# Patient Record
Sex: Female | Born: 1935 | Race: Black or African American | Hispanic: No | Smoking: Never smoker
Health system: Southern US, Community
[De-identification: ages and names within clinical notes are randomized; demographics above are authoritative.]

## PROBLEM LIST (undated history)

## (undated) DIAGNOSIS — M1990 Unspecified osteoarthritis, unspecified site: Secondary | ICD-10-CM

## (undated) DIAGNOSIS — E119 Type 2 diabetes mellitus without complications: Secondary | ICD-10-CM

## (undated) DIAGNOSIS — I10 Essential (primary) hypertension: Secondary | ICD-10-CM

## (undated) HISTORY — DX: Type 2 diabetes mellitus without complications: E11.9

## (undated) HISTORY — PX: BREAST EXCISIONAL BIOPSY: SUR124

---

## 2001-01-29 ENCOUNTER — Other Ambulatory Visit: Admission: RE | Admit: 2001-01-29 | Discharge: 2001-01-29 | Payer: Self-pay | Admitting: *Deleted

## 2001-05-22 ENCOUNTER — Ambulatory Visit (HOSPITAL_COMMUNITY): Admission: RE | Admit: 2001-05-22 | Discharge: 2001-05-22 | Payer: Self-pay | Admitting: Gastroenterology

## 2001-08-18 ENCOUNTER — Encounter: Admission: RE | Admit: 2001-08-18 | Discharge: 2001-08-18 | Payer: Self-pay | Admitting: Internal Medicine

## 2001-08-18 ENCOUNTER — Encounter: Payer: Self-pay | Admitting: Internal Medicine

## 2003-05-24 ENCOUNTER — Encounter: Admission: RE | Admit: 2003-05-24 | Discharge: 2003-05-24 | Payer: Self-pay | Admitting: Internal Medicine

## 2004-06-16 ENCOUNTER — Encounter: Admission: RE | Admit: 2004-06-16 | Discharge: 2004-06-16 | Payer: Self-pay | Admitting: Internal Medicine

## 2004-06-30 ENCOUNTER — Ambulatory Visit (HOSPITAL_COMMUNITY): Admission: RE | Admit: 2004-06-30 | Discharge: 2004-06-30 | Payer: Self-pay | Admitting: Ophthalmology

## 2004-08-11 ENCOUNTER — Encounter: Admission: RE | Admit: 2004-08-11 | Discharge: 2004-08-11 | Payer: Self-pay | Admitting: Internal Medicine

## 2005-12-26 ENCOUNTER — Encounter: Admission: RE | Admit: 2005-12-26 | Discharge: 2005-12-26 | Payer: Self-pay | Admitting: Internal Medicine

## 2007-01-01 ENCOUNTER — Encounter: Admission: RE | Admit: 2007-01-01 | Discharge: 2007-01-01 | Payer: Self-pay | Admitting: Internal Medicine

## 2008-01-02 ENCOUNTER — Encounter: Admission: RE | Admit: 2008-01-02 | Discharge: 2008-01-02 | Payer: Self-pay | Admitting: Internal Medicine

## 2008-01-21 ENCOUNTER — Encounter: Admission: RE | Admit: 2008-01-21 | Discharge: 2008-01-21 | Payer: Self-pay | Admitting: Internal Medicine

## 2009-01-03 ENCOUNTER — Encounter: Admission: RE | Admit: 2009-01-03 | Discharge: 2009-01-03 | Payer: Self-pay | Admitting: Internal Medicine

## 2009-06-03 ENCOUNTER — Emergency Department (HOSPITAL_COMMUNITY): Admission: EM | Admit: 2009-06-03 | Discharge: 2009-06-03 | Payer: Self-pay | Admitting: Emergency Medicine

## 2010-01-06 ENCOUNTER — Encounter
Admission: RE | Admit: 2010-01-06 | Discharge: 2010-01-06 | Payer: Self-pay | Source: Home / Self Care | Attending: Internal Medicine | Admitting: Internal Medicine

## 2010-04-03 LAB — URINALYSIS, ROUTINE W REFLEX MICROSCOPIC
Glucose, UA: 500 mg/dL — AB
Ketones, ur: NEGATIVE mg/dL
Nitrite: NEGATIVE
Specific Gravity, Urine: 1.006 (ref 1.005–1.030)
Urobilinogen, UA: 0.2 mg/dL (ref 0.0–1.0)

## 2010-04-03 LAB — COMPREHENSIVE METABOLIC PANEL
AST: 17 U/L (ref 0–37)
Albumin: 3.9 g/dL (ref 3.5–5.2)
BUN: 8 mg/dL (ref 6–23)
CO2: 27 mEq/L (ref 19–32)
Calcium: 9.2 mg/dL (ref 8.4–10.5)
Creatinine, Ser: 0.63 mg/dL (ref 0.4–1.2)
GFR calc Af Amer: >60 mL/min (ref >60)
GFR calc non Af Amer: >60 mL/min (ref >60)
Glucose, Bld: 146 mg/dL — ABNORMAL HIGH (ref 70–99)
Sodium: 138 mEq/L (ref 135–145)
Total Bilirubin: 0.5 mg/dL (ref 0.3–1.2)
Total Protein: 7.3 g/dL (ref 6.0–8.3)

## 2010-04-03 LAB — POCT CARDIAC MARKERS
CKMB, poc: 1.6 ng/mL (ref 1.0–8.0)
Myoglobin, poc: 64.8 ng/mL (ref 12–200)
Myoglobin, poc: 71 ng/mL (ref 12–200)
Troponin i, poc: <0.05 ng/mL (ref 0.00–0.09)

## 2010-04-03 LAB — CBC
MCHC: 32.5 g/dL (ref 30.0–36.0)
MCV: 89.5 fL (ref 78.0–100.0)
RDW: 13.7 % (ref 11.5–15.5)
WBC: 4.3 10*3/uL (ref 4.0–10.5)

## 2010-04-03 LAB — DIFFERENTIAL: Neutrophils Relative %: 77 % (ref 43–77)

## 2010-06-02 NOTE — Op Note (Signed)
NAMEADRIAUNA, April Gallagher                 ACCOUNT NO.:  [id number]   MEDICAL RECORD NO.:  [id number]          PATIENT TYPE:  OIB   LOCATION:  2899                         FACILITY:  [facility]   PHYSICIAN:  Guadelupe Sabin, M.D.DATE OF BIRTH:  18-Oct-1926   DATE OF PROCEDURE:  06/30/2004  DATE OF DISCHARGE:  06/30/2004                                 OPERATIVE REPORT   PREOPERATIVE DIAGNOSIS:  Senile nuclear cataract, right eye.   POSTOPERATIVE DIAGNOSIS:  Senile nuclear cataract, right eye.   NAME OF OPERATION:  Planned extracapsular cataract extraction with  phacoemulsification and primary insertion of posterior chamber intraocular  lens implant.   SURGEON:  Guadelupe Sabin, M.D.   ASSISTANT:  Nurse.   ANESTHESIA:  Local 4% Xylocaine, 0.75 Marcaine retrobulbar block, topical  tetracaine, intraocular Xylocaine, anesthesia standby required in this  elderly patient.   OPERATIVE PROCEDURE:  After the patient was prepped and draped, a lid  speculum was inserted in the left eye. The eye was turned downward and a  superior rectus traction suture placed. Schiotz tonometry was recorded at 8-  10 scale units with a 5.5 g weight. A peritomy was performed adjacent to the  limbus from the 11 to 1 o'clock position. The corneoscleral junction was  cleaned and a corneoscleral groove made with a 45-degree Superblade. The  anterior chamber was then entered with the 2.5 mm diamond keratome at the 12  o'clock position and the 15-degree blade at the 2:30 position. Using a bent  26 gauge needle on an Ocucoat syringe, a circular capsulorrhexis was begun  and then completed with the Grabow forceps. Hydrodissection and  hydrodelineation were performed using 1% Xylocaine. The 30-degree  phacoemulsification tip was then inserted with slow controlled  emulsification of the lens nucleus with back cracking and fragmentation with  the Bechert pick. Total ultrasonic time 1 minute 20 seconds, average power  level  15%, total amount of fluid used 50 mL. Following removal of the  nucleus, the residual cortex was aspirated with the irrigation-aspiration  tip. The posterior capsule appeared intact with a brilliant red fundus  reflex. It was therefore elected to insert an [employer] SI40NB  silicone three-piece posterior chamber intraocular lens implant, diopter  strength +21.00. This was inserted with the McDonald forceps into the  anterior chamber and then centered into the capsular bag using the [facility]  lens rotator. The lens appeared to be well centered. The Ocucoat and  Provisc, which had been used intermittently, were aspirated and replaced  with balanced salt solution and Miochol ophthalmic solution. The operative  incisions were self-sealing. It was elected, however, to place a single 10-0  interrupted nylon suture across the 12 o'clock incision to ensure closure  and to prevent  endophthalmitis. Maxitrol ointment was instilled in the conjunctival cul-de-  sac and a light patch and protective shield applied. Duration of procedure  and anesthesia administration 45 minutes. The patient tolerated the  procedure well in general and left the operating room for the recovery room  in good condition.       HNJ/MEDQ  D:  07/01/2004  T:  07/01/2004  Job:  [id number]

## 2010-06-02 NOTE — Procedures (Signed)
[facility]  Patient:    KINGSLEE, DOWSE Visit Number: [id number] MRN: [id number]          Service Type: END Location: ENDO Attending Physician:  Charna Elizabeth Dictated by:   Anselmo Rod, M.D. Proc. Date: 05/22/01 Admit Date:  05/22/2001   CC:         Kern Reap, M.D.   Procedure Report  DATE OF BIRTH:  07/19/1926  REFERRING PHYSICIAN:  Kern Reap, M.D.  PROCEDURE PERFORMED:  Screening colonoscopy.  ENDOSCOPIST:  Anselmo Rod, M.D.  INSTRUMENT USED:  Olympus video colonoscope.  INDICATIONS FOR PROCEDURE:  The patient is a 77 year old African-American female with a history of colon cancer in her sister and her mother.  Rule out colonic polyps, masses, hemorrhoids, etc.  PREPROCEDURE PREPARATION:  Informed consent was procured from the patient. The patient was fasted for eight hours prior to the procedure and prepped with a bottle of magnesium citrate and a gallon of NuLytely the night prior to the procedure.  PREPROCEDURE PHYSICAL:  The patient had stable vital signs.  Neck supple. Chest clear to auscultation.  S1, S2 regular.  Abdomen soft with normal bowel sounds.  DESCRIPTION OF PROCEDURE:  The patient was placed in the left lateral decubitus position and sedated with 50 mg of Demerol and 5 mg of Versed intravenously.  Once the patient was adequately sedated and maintained on low-flow oxygen and continuous cardiac monitoring, the Olympus video colonoscope was advanced from the rectum to the cecum with extreme difficulty. The patient has a very atonic and tortuous colon.  A few scattered diverticula were seen throughout the colon.  No masses or polyps were present.  IMPRESSION: 1. Pandiverticulosis. 2. No masses or polyps seen.  RECOMMENDATIONS: 1. Repeat colorectal cancer screening is recommended in the next five years    unless the patient were to develop any abnormal symptoms in the interim. 2. A high  fiber diet has been recommended. 3. Outpatient follow-up is advised as need arises. Dictated by:   Anselmo Rod, M.D. Attending Physician:  Charna Elizabeth DD:  05/22/01 TD:  05/23/01 Job: [id number]

## 2010-06-02 NOTE — H&P (Signed)
April Gallagher, April Gallagher                 ACCOUNT NO.:  [id number]   MEDICAL RECORD NO.:  [id number]          PATIENT TYPE:  OIB   LOCATION:  2899                         FACILITY:  [facility]   PHYSICIAN:  Guadelupe Sabin, M.D.DATE OF BIRTH:  03/16/1926   DATE OF ADMISSION:  06/30/2004  DATE OF DISCHARGE:  06/30/2004                                HISTORY & PHYSICAL   This was a planned outpatient surgical admission of this 77 year old black  female admitted for cataract implant surgery of the right eye.   PRESENT ILLNESS:  This patient was recently seen by her optometrist Dr.  Burna Sis and was found to have bilateral cataract formation. The patient  was referred to my office where this diagnosis was confirmed and  arrangements made for the patient's outpatient admission at this time.   PAST MEDICAL HISTORY:  The patient is under the care of Dr. Barbee Shropshire. The  patient is felt to be a non-insulin-dependent diabetic with diet control  only. The patient checks her sugars occasionally. Blood pressure is stable.   CURRENT MEDICATIONS:  Micardis, Lipitor, garlic, Fosamax, flaxseed and  calcium.   REVIEW OF SYSTEMS:  No current cardiorespiratory complaints.   PHYSICAL EXAMINATION:  VITAL SIGNS:  As recorded on admission, blood  pressure 180/86, pulse 80, respirations 16 and temperature 97.8 degrees.  GENERAL APPEARANCE:  The patient is a pleasant, well-nourished, well-  developed, black female in no acute distress.  HEENT:  Eyes:  Visual acuity without correction 20/100 right eye and 20/40  left eye. With refraction 20/50 right eye and 20/25 left eye. On slit lamp  examination, the eyes are white and clear with a clear cornea and deep and  clear anterior chamber. A dense nuclear cataract is present in both eyes.  Dilated detailed fundus examination with indirect ophthalmoscopy shows a  clear vitreous behind the cataractous lens. The retina is attached. The  optic nerve is sharply outlined and  of good color. Disk/cup ratio 0.4. Blood  vessels and macula normal.  CHEST:  Lungs clear to percussion and auscultation.  HEART:  Normal sinus rhythm. No cardiomegaly. No murmurs.  ABDOMEN:  Negative.  EXTREMITIES:  Negative.   ADMISSION DIAGNOSIS:  Senile cataract, both eyes.   SURGICAL PLAN:  Cataract implant surgery, right eye now and left eye later  as needed.       HNJ/MEDQ  D:  07/01/2004  T:  07/01/2004  Job:  [id number]

## 2012-04-08 ENCOUNTER — Other Ambulatory Visit: Payer: Self-pay | Admitting: Internal Medicine

## 2012-04-08 DIAGNOSIS — Z1231 Encounter for screening mammogram for malignant neoplasm of breast: Secondary | ICD-10-CM

## 2012-05-08 ENCOUNTER — Ambulatory Visit
Admission: RE | Admit: 2012-05-08 | Discharge: 2012-05-08 | Disposition: A | Discharge status: Home / Self Care | Payer: Medicare Other | Source: Ambulatory Visit | Attending: Internal Medicine | Admitting: Internal Medicine

## 2012-05-08 DIAGNOSIS — Z1231 Encounter for screening mammogram for malignant neoplasm of breast: Secondary | ICD-10-CM

## 2012-10-23 ENCOUNTER — Ambulatory Visit: Payer: Self-pay | Admitting: Podiatry

## 2013-05-27 DIAGNOSIS — H26499 Other secondary cataract, unspecified eye: Secondary | ICD-10-CM | POA: Insufficient documentation

## 2013-08-03 ENCOUNTER — Encounter: Payer: Self-pay | Admitting: Podiatry

## 2013-08-03 ENCOUNTER — Ambulatory Visit (INDEPENDENT_AMBULATORY_CARE_PROVIDER_SITE_OTHER): Payer: Medicare Other | Admitting: Podiatry

## 2013-08-03 DIAGNOSIS — M79609 Pain in unspecified limb: Secondary | ICD-10-CM

## 2013-08-03 DIAGNOSIS — M79673 Pain in unspecified foot: Secondary | ICD-10-CM

## 2013-08-03 DIAGNOSIS — Q828 Other specified congenital malformations of skin: Secondary | ICD-10-CM

## 2013-08-03 DIAGNOSIS — B351 Tinea unguium: Secondary | ICD-10-CM

## 2013-08-03 DIAGNOSIS — E1159 Type 2 diabetes mellitus with other circulatory complications: Secondary | ICD-10-CM

## 2013-08-03 NOTE — Patient Instructions (Signed)
Diabetes and Foot Care Diabetes may cause you to have problems because of poor blood supply (circulation) to your feet and legs. This may cause the skin on your feet to become thinner, break easier, and heal more slowly. Your skin may become dry, and the skin may peel and crack. You may also have nerve damage in your legs and feet causing decreased feeling in them. You may not notice minor injuries to your feet that could lead to infections or more serious problems. Taking care of your feet is one of the most important things you can do for yourself.  HOME CARE INSTRUCTIONS  Wear shoes at all times, even in the house. Do not go barefoot. Bare feet are easily injured.  Check your feet daily for blisters, cuts, and redness. If you cannot see the bottom of your feet, use a mirror or ask someone for help.  Wash your feet with warm water (do not use hot water) and mild soap. Then pat your feet and the areas between your toes until they are completely dry. Do not soak your feet as this can dry your skin.  Apply a moisturizing lotion or petroleum jelly (that does not contain alcohol and is unscented) to the skin on your feet and to dry, brittle toenails. Do not apply lotion between your toes.  Trim your toenails straight across. Do not dig under them or around the cuticle. File the edges of your nails with an emery board or nail file.  Do not cut corns or calluses or try to remove them with medicine.  Wear clean socks or stockings every day. Make sure they are not too tight. Do not wear knee-high stockings since they may decrease blood flow to your legs.  Wear shoes that fit properly and have enough cushioning. To break in new shoes, wear them for just a few hours a day. This prevents you from injuring your feet. Always look in your shoes before you put them on to be sure there are no objects inside.  Do not cross your legs. This may decrease the blood flow to your feet.  If you find a minor scrape,  cut, or break in the skin on your feet, keep it and the skin around it clean and dry. These areas may be cleansed with mild soap and water. Do not cleanse the area with peroxide, alcohol, or iodine.  When you remove an adhesive bandage, be sure not to damage the skin around it.  If you have a wound, look at it several times a day to make sure it is healing.  Do not use heating pads or hot water bottles. They may burn your skin. If you have lost feeling in your feet or legs, you may not know it is happening until it is too late.  Make sure your health care provider performs a complete foot exam at least annually or more often if you have foot problems. Report any cuts, sores, or bruises to your health care provider immediately. SEEK MEDICAL CARE IF:   You have an injury that is not healing.  You have cuts or breaks in the skin.  You have an ingrown nail.  You notice redness on your legs or feet.  You feel burning or tingling in your legs or feet.  You have pain or cramps in your legs and feet.  Your legs or feet are numb.  Your feet always feel cold. SEEK IMMEDIATE MEDICAL CARE IF:   There is increasing redness,   swelling, or pain in or around a wound.  There is a red line that goes up your leg.  Pus is coming from a wound.  You develop a fever or as directed by your health care provider.  You notice a bad smell coming from an ulcer or wound. Document Released: 12/30/1999 Document Revised: 09/03/2012 Document Reviewed: 06/10/2012 ExitCare Patient Information 2015 [employer]. This information is not intended to replace advice given to you by your health care provider. Make sure you discuss any questions you have with your health care provider.  

## 2013-08-05 NOTE — Progress Notes (Signed)
Subjective:     Patient ID: April Gallagher, female   DOB: [birth year], 80 y.o.   MRN: [id number]  HPI patient presents with thick yellow nailbeds 1-5 both feet and lesions on plantar aspect of the fourth metatarsal right and third toe right that's painful with inability to cut herself and long-term diabetes   Review of Systems     Objective:   Physical Exam Neurovascular status diminished but no change with patient having E. long gaited thick and yellow brittle nailbeds 1-5 both feet and keratotic lesion sub-fourth metatarsal right third toe right that are painful when pressed    Assessment:     At risk diabetic with nail disease 1-5 both feet and lesions on the plantar aspect of the right foot that are painful    Plan:     Debris painful nailbeds 1-5 both feet and lesions on the right foot with no iatrogenic bleeding noted

## 2013-11-02 ENCOUNTER — Ambulatory Visit: Payer: Medicare Other | Admitting: Podiatry

## 2013-11-09 DIAGNOSIS — Z961 Presence of intraocular lens: Secondary | ICD-10-CM | POA: Insufficient documentation

## 2014-05-15 DIAGNOSIS — H4051X2 Glaucoma secondary to other eye disorders, right eye, moderate stage: Secondary | ICD-10-CM | POA: Insufficient documentation

## 2014-06-16 ENCOUNTER — Encounter (HOSPITAL_COMMUNITY): Payer: Self-pay | Admitting: Emergency Medicine

## 2014-06-16 ENCOUNTER — Emergency Department (HOSPITAL_COMMUNITY)
Admission: EM | Admit: 2014-06-16 | Discharge: 2014-06-16 | Disposition: A | Discharge status: Home / Self Care | Payer: Medicare Other | Attending: Emergency Medicine | Admitting: Emergency Medicine

## 2014-06-16 ENCOUNTER — Emergency Department (HOSPITAL_COMMUNITY): Payer: Medicare Other

## 2014-06-16 DIAGNOSIS — Y92410 Unspecified street and highway as the place of occurrence of the external cause: Secondary | ICD-10-CM | POA: Diagnosis not present

## 2014-06-16 DIAGNOSIS — Z79899 Other long term (current) drug therapy: Secondary | ICD-10-CM | POA: Insufficient documentation

## 2014-06-16 DIAGNOSIS — E119 Type 2 diabetes mellitus without complications: Secondary | ICD-10-CM | POA: Diagnosis not present

## 2014-06-16 DIAGNOSIS — I10 Essential (primary) hypertension: Secondary | ICD-10-CM | POA: Diagnosis not present

## 2014-06-16 DIAGNOSIS — Y9389 Activity, other specified: Secondary | ICD-10-CM | POA: Diagnosis not present

## 2014-06-16 DIAGNOSIS — Z7952 Long term (current) use of systemic steroids: Secondary | ICD-10-CM | POA: Diagnosis not present

## 2014-06-16 DIAGNOSIS — S299XXA Unspecified injury of thorax, initial encounter: Secondary | ICD-10-CM | POA: Diagnosis present

## 2014-06-16 DIAGNOSIS — S29019A Strain of muscle and tendon of unspecified wall of thorax, initial encounter: Secondary | ICD-10-CM

## 2014-06-16 DIAGNOSIS — Y998 Other external cause status: Secondary | ICD-10-CM | POA: Diagnosis not present

## 2014-06-16 DIAGNOSIS — S29012A Strain of muscle and tendon of back wall of thorax, initial encounter: Secondary | ICD-10-CM | POA: Diagnosis not present

## 2014-06-16 DIAGNOSIS — Z8739 Personal history of other diseases of the musculoskeletal system and connective tissue: Secondary | ICD-10-CM | POA: Insufficient documentation

## 2014-06-16 HISTORY — DX: Unspecified osteoarthritis, unspecified site: M19.90

## 2014-06-16 HISTORY — DX: Essential (primary) hypertension: I10
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
## 2014-06-16 NOTE — ED Notes (Signed)
Signature pad not working. 
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
## 2014-07-05 DIAGNOSIS — M545 Low back pain, unspecified: Secondary | ICD-10-CM | POA: Insufficient documentation

## 2014-09-07 ENCOUNTER — Ambulatory Visit (INDEPENDENT_AMBULATORY_CARE_PROVIDER_SITE_OTHER): Payer: Medicare Other | Admitting: Podiatry

## 2014-09-07 ENCOUNTER — Encounter: Payer: Self-pay | Admitting: Podiatry

## 2014-09-07 DIAGNOSIS — B351 Tinea unguium: Secondary | ICD-10-CM | POA: Diagnosis not present

## 2014-09-07 DIAGNOSIS — Q828 Other specified congenital malformations of skin: Secondary | ICD-10-CM

## 2014-09-07 DIAGNOSIS — M79673 Pain in unspecified foot: Secondary | ICD-10-CM

## 2014-09-07 NOTE — Progress Notes (Signed)
Patient ID: April Gallagher, female   DOB: Feb 23, 1926, 81 y.o.   MRN: [id number] Complaint:  Visit Type: Patient returns to my office for continued preventative foot care services. Complaint: Patient states" my nails have grown long and thick and become painful to walk and wear shoes" Patient has been diagnosed with DM with no foot complications. The patient presents for preventative foot care services. No changes to ROS  Podiatric Exam: Vascular: dorsalis pedis and posterior tibial pulses are palpable bilateral. Capillary return is immediate. Temperature gradient is WNL. Skin turgor WNL  Sensorium: Normal Semmes Weinstein monofilament test. Normal tactile sensation bilaterally. Nail Exam: Pt has thick disfigured discolored nails with subungual debris noted bilateral entire nail hallux through fifth toenails Ulcer Exam: There is no evidence of ulcer or pre-ulcerative changes or infection. Orthopedic Exam: Muscle tone and strength are WNL. No limitations in general ROM. No crepitus or effusions noted. Foot type and digits show no abnormalities. Bony prominences are unremarkable. Skin: No Porokeratosis. No infection or ulcers.Porokeratosis sub 2,4 right foot and sub 3 left foot.  Diagnosis:  Onychomycosis, , Pain in right toe, pain in left toes,  Debride porokeratosis  Treatment & Plan Procedures and Treatment: Consent by patient was obtained for treatment procedures. The patient understood the discussion of treatment and procedures well. All questions were answered thoroughly reviewed. Debridement of mycotic and hypertrophic toenails, 1 through 5 bilateral and clearing of subungual debris. No ulceration, no infection noted.  Return Visit-Office Procedure: Patient instructed to return to the office for a follow up visit 3 months for continued evaluation and treatment.

## 2014-10-13 DIAGNOSIS — R269 Unspecified abnormalities of gait and mobility: Secondary | ICD-10-CM | POA: Insufficient documentation

## 2014-12-15 ENCOUNTER — Other Ambulatory Visit: Payer: Self-pay | Admitting: Internal Medicine

## 2014-12-15 DIAGNOSIS — E2839 Other primary ovarian failure: Secondary | ICD-10-CM

## 2014-12-17 ENCOUNTER — Other Ambulatory Visit: Payer: Self-pay | Admitting: Internal Medicine

## 2014-12-17 DIAGNOSIS — Z1231 Encounter for screening mammogram for malignant neoplasm of breast: Secondary | ICD-10-CM

## 2015-01-20 ENCOUNTER — Ambulatory Visit
Admission: RE | Admit: 2015-01-20 | Discharge: 2015-01-20 | Disposition: A | Discharge status: Home / Self Care | Payer: Medicare Other | Source: Ambulatory Visit | Attending: Internal Medicine | Admitting: Internal Medicine

## 2015-01-20 DIAGNOSIS — Z1231 Encounter for screening mammogram for malignant neoplasm of breast: Secondary | ICD-10-CM

## 2015-01-20 DIAGNOSIS — E2839 Other primary ovarian failure: Secondary | ICD-10-CM

## 2015-01-25 ENCOUNTER — Ambulatory Visit: Payer: Medicare Other | Admitting: Podiatry

## 2015-06-28 ENCOUNTER — Encounter: Payer: Self-pay | Admitting: Podiatry

## 2015-06-28 ENCOUNTER — Ambulatory Visit (INDEPENDENT_AMBULATORY_CARE_PROVIDER_SITE_OTHER): Payer: Medicare Other | Admitting: Podiatry

## 2015-06-28 DIAGNOSIS — Q828 Other specified congenital malformations of skin: Secondary | ICD-10-CM

## 2015-06-28 DIAGNOSIS — M79673 Pain in unspecified foot: Secondary | ICD-10-CM | POA: Diagnosis not present

## 2015-06-28 DIAGNOSIS — B351 Tinea unguium: Secondary | ICD-10-CM

## 2015-06-28 NOTE — Progress Notes (Signed)
Patient ID: April Gallagher, female   DOB: Sep 01, 1926, 82 y.o.   MRN: [id number] Complaint:  Visit Type: Patient returns to my office for continued preventative foot care services. Complaint: Patient states" my nails have grown long and thick and become painful to walk and wear shoes" Patient has been diagnosed with DM with no foot complications. The patient presents for preventative foot care services. No changes to ROS  Podiatric Exam: Vascular: dorsalis pedis and posterior tibial pulses are palpable bilateral. Capillary return is immediate. Temperature gradient is WNL. Skin turgor WNL  Sensorium: Normal Semmes Weinstein monofilament test. Normal tactile sensation bilaterally. Nail Exam: Pt has thick disfigured discolored nails with subungual debris noted bilateral entire nail hallux through fifth toenails Ulcer Exam: There is no evidence of ulcer or pre-ulcerative changes or infection. Orthopedic Exam: Muscle tone and strength are WNL. No limitations in general ROM. No crepitus or effusions noted. Foot type and digits show no abnormalities. Bony prominences are unremarkable. Skin: No Porokeratosis. No infection or ulcers.Porokeratosis sub 2,4 right foot and sub 1,2 left foot.  Diagnosis:  Onychomycosis, , Pain in right toe, pain in left toes,  Debride porokeratosis  Treatment & Plan Procedures and Treatment: Consent by patient was obtained for treatment procedures. The patient understood the discussion of treatment and procedures well. All questions were answered thoroughly reviewed. Debridement of mycotic and hypertrophic toenails, 1 through 5 bilateral and clearing of subungual debris. No ulceration, no infection noted.  Return Visit-Office Procedure: Patient instructed to return to the office for a follow up visit 3 months for continued evaluation and treatment.   Gardiner Barefoot DPM

## 2015-11-01 ENCOUNTER — Ambulatory Visit: Payer: Medicare Other | Admitting: Podiatry

## 2015-11-25 ENCOUNTER — Ambulatory Visit (INDEPENDENT_AMBULATORY_CARE_PROVIDER_SITE_OTHER): Payer: Medicare Other | Admitting: Podiatry

## 2015-11-25 ENCOUNTER — Encounter: Payer: Self-pay | Admitting: Podiatry

## 2015-11-25 DIAGNOSIS — B351 Tinea unguium: Secondary | ICD-10-CM

## 2015-11-25 DIAGNOSIS — M79676 Pain in unspecified toe(s): Secondary | ICD-10-CM | POA: Diagnosis not present

## 2015-11-25 DIAGNOSIS — Q828 Other specified congenital malformations of skin: Secondary | ICD-10-CM | POA: Diagnosis not present

## 2015-11-25 NOTE — Progress Notes (Signed)
Patient ID: TAYLYNN EASTIN, female   DOB: 05/05/1926, 82 y.o.   MRN: [id number] Complaint:  Visit Type: Patient returns to my office for continued preventative foot care services. Complaint: Patient states" my nails have grown long and thick and become painful to walk and wear shoes" Patient has been diagnosed with DM with no foot complications. The patient presents for preventative foot care services. No changes to ROS  Podiatric Exam: Vascular: dorsalis pedis and posterior tibial pulses are palpable bilateral. Capillary return is immediate. Temperature gradient is WNL. Skin turgor WNL  Sensorium: Normal Semmes Weinstein monofilament test. Normal tactile sensation bilaterally. Nail Exam: Pt has thick disfigured discolored nails with subungual debris noted bilateral entire nail hallux through fifth toenails Ulcer Exam: There is no evidence of ulcer or pre-ulcerative changes or infection. Orthopedic Exam: Muscle tone and strength are WNL. No limitations in general ROM. No crepitus or effusions noted. Foot type and digits show no abnormalities. Bony prominences are unremarkable. Skin: No Porokeratosis. No infection or ulcers.Porokeratosis sub 2,4 right foot and sub 1,2 left foot.  Diagnosis:  Onychomycosis, , Pain in right toe, pain in left  porokeratosis  Treatment & Plan Procedures and Treatment: Consent by patient was obtained for treatment procedures. The patient understood the discussion of treatment and procedures well. All questions were answered thoroughly reviewed. Debridement of mycotic and hypertrophic toenails, 1 through 5 bilateral and clearing of subungual debris. No ulceration, no infection noted. Debride porokeratosis.  Patient requested her nails not to be cut too short. Return Visit-Office Procedure: Patient instructed to return to the office for a follow up visit 3 months for continued evaluation and treatment.   Gardiner Barefoot DPM

## 2016-02-22 ENCOUNTER — Other Ambulatory Visit: Payer: Self-pay | Admitting: Internal Medicine

## 2016-02-22 DIAGNOSIS — Z1231 Encounter for screening mammogram for malignant neoplasm of breast: Secondary | ICD-10-CM

## 2016-04-05 ENCOUNTER — Ambulatory Visit
Admission: RE | Admit: 2016-04-05 | Discharge: 2016-04-05 | Disposition: A | Discharge status: Home / Self Care | Payer: Medicare Other | Source: Ambulatory Visit | Attending: Internal Medicine | Admitting: Internal Medicine

## 2016-04-05 DIAGNOSIS — Z1231 Encounter for screening mammogram for malignant neoplasm of breast: Secondary | ICD-10-CM

## 2016-05-25 ENCOUNTER — Ambulatory Visit: Payer: Medicare Other | Admitting: Podiatry

## 2016-06-20 ENCOUNTER — Ambulatory Visit (INDEPENDENT_AMBULATORY_CARE_PROVIDER_SITE_OTHER): Payer: Medicare Other | Admitting: Podiatry

## 2016-06-20 DIAGNOSIS — M79676 Pain in unspecified toe(s): Secondary | ICD-10-CM

## 2016-06-20 DIAGNOSIS — Q828 Other specified congenital malformations of skin: Secondary | ICD-10-CM | POA: Diagnosis not present

## 2016-06-20 DIAGNOSIS — B351 Tinea unguium: Secondary | ICD-10-CM

## 2016-06-20 NOTE — Progress Notes (Signed)
Patient ID: April Gallagher, female   DOB: 1926/08/16, 83 y.o.   MRN: [id number] Complaint:  Visit Type: Patient returns to my office for continued preventative foot care services. Complaint: Patient states" my nails have grown long and thick and become painful to walk and wear shoes" Patient has been diagnosed with DM with no foot complications. The patient presents for preventative foot care services. No changes to ROS  Podiatric Exam: Vascular: dorsalis pedis and posterior tibial pulses are palpable bilateral. Capillary return is immediate. Temperature gradient is WNL. Skin turgor WNL  Sensorium: Normal Semmes Weinstein monofilament test. Normal tactile sensation bilaterally. Nail Exam: Pt has thick disfigured discolored nails with subungual debris noted bilateral entire nail hallux through fifth toenails Ulcer Exam: There is no evidence of ulcer or pre-ulcerative changes or infection. Orthopedic Exam: Muscle tone and strength are WNL. No limitations in general ROM. No crepitus or effusions noted. Foot type and digits show no abnormalities. Bony prominences are unremarkable. Skin: No Porokeratosis. No infection or ulcers.Porokeratosis sub 1,2  right foot and sub 1,2 left foot.  Diagnosis:  Onychomycosis, , Pain in right toe, pain in left  porokeratosis  Treatment & Plan Procedures and Treatment: Consent by patient was obtained for treatment procedures. The patient understood the discussion of treatment and procedures well. All questions were answered thoroughly reviewed. Debridement of mycotic and hypertrophic toenails, 1 through 5 bilateral and clearing of subungual debris. No ulceration, no infection noted. Debride porokeratosis.  Patient requested her nails not to be cut too short. Return Visit-Office Procedure: Patient instructed to return to the office for a follow up visit 3 months for continued evaluation and treatment.   Gardiner Barefoot DPM

## 2016-10-30 ENCOUNTER — Ambulatory Visit: Payer: Medicare Other | Admitting: Podiatry

## 2017-03-12 ENCOUNTER — Other Ambulatory Visit: Payer: Self-pay | Admitting: Internal Medicine

## 2017-03-12 DIAGNOSIS — Z139 Encounter for screening, unspecified: Secondary | ICD-10-CM

## 2017-04-03 ENCOUNTER — Encounter: Payer: Self-pay | Admitting: Podiatry

## 2017-04-03 ENCOUNTER — Ambulatory Visit: Payer: Medicare Other | Admitting: Podiatry

## 2017-04-03 DIAGNOSIS — M79674 Pain in right toe(s): Secondary | ICD-10-CM

## 2017-04-03 DIAGNOSIS — B351 Tinea unguium: Secondary | ICD-10-CM | POA: Diagnosis not present

## 2017-04-03 DIAGNOSIS — M79675 Pain in left toe(s): Secondary | ICD-10-CM

## 2017-04-03 DIAGNOSIS — Q828 Other specified congenital malformations of skin: Secondary | ICD-10-CM | POA: Diagnosis not present

## 2017-04-03 NOTE — Progress Notes (Addendum)
Patient ID: April Gallagher, female   DOB: January 09, 1927, 84 y.o.   MRN: [id number] Complaint:  Visit Type: Patient returns to my office for continued preventative foot care services. Complaint: Patient states" my nails have grown long and thick and become painful to walk and wear shoes" Patient has been diagnosed with DM with no foot complications. The patient presents for preventative foot care services. No changes to ROS.  Painful callus both feet. Patient has not been seen for over 9 months.  Podiatric Exam: Vascular: dorsalis pedis and posterior tibial pulses are palpable bilateral. Capillary return is immediate. Temperature gradient is WNL. Skin turgor WNL  Sensorium: Normal Semmes Weinstein monofilament test. Normal tactile sensation bilaterally. Nail Exam: Pt has thick disfigured discolored nails with subungual debris noted bilateral entire nail hallux through fifth toenails Ulcer Exam: There is no evidence of ulcer or pre-ulcerative changes or infection. Orthopedic Exam: Muscle tone and strength are WNL. No limitations in general ROM. No crepitus or effusions noted. Foot type and digits show no abnormalities. Bony prominences are unremarkable. Skin: No Porokeratosis. No infection or ulcers.Porokeratosis sub 2,5  right foot and sub 1,2 left foot.  Diagnosis:  Onychomycosis, , Pain in right toe, pain in left  porokeratosis  Treatment & Plan Procedures and Treatment: Consent by patient was obtained for treatment procedures. The patient understood the discussion of treatment and procedures well. All questions were answered thoroughly reviewed. Debridement of mycotic and hypertrophic toenails, 1 through 5 bilateral and clearing of subungual debris. No ulceration, no infection noted. Debride porokeratosis.   Return Visit-Office Procedure: Patient instructed to return to the office for a follow up visit 3 months for continued evaluation and treatment.   Gardiner Barefoot DPM

## 2017-04-09 ENCOUNTER — Ambulatory Visit
Admission: RE | Admit: 2017-04-09 | Discharge: 2017-04-09 | Disposition: A | Discharge status: Home / Self Care | Payer: Medicare Other | Source: Ambulatory Visit | Attending: Internal Medicine | Admitting: Internal Medicine

## 2017-04-09 DIAGNOSIS — Z139 Encounter for screening, unspecified: Secondary | ICD-10-CM

## 2017-07-26 ENCOUNTER — Ambulatory Visit: Payer: Medicare Other | Admitting: Podiatry

## 2018-01-15 DIAGNOSIS — C50919 Malignant neoplasm of unspecified site of unspecified female breast: Secondary | ICD-10-CM

## 2018-01-15 HISTORY — DX: Malignant neoplasm of unspecified site of unspecified female breast: C50.919

## 2018-01-15 HISTORY — PX: BREAST BIOPSY: SHX20

## 2018-01-29 ENCOUNTER — Encounter: Payer: Self-pay | Admitting: Podiatry

## 2018-01-29 ENCOUNTER — Ambulatory Visit: Payer: Medicare Other | Admitting: Podiatry

## 2018-01-29 DIAGNOSIS — M79674 Pain in right toe(s): Secondary | ICD-10-CM

## 2018-01-29 DIAGNOSIS — Q828 Other specified congenital malformations of skin: Secondary | ICD-10-CM

## 2018-01-29 DIAGNOSIS — E785 Hyperlipidemia, unspecified: Secondary | ICD-10-CM | POA: Insufficient documentation

## 2018-01-29 DIAGNOSIS — M1710 Unilateral primary osteoarthritis, unspecified knee: Secondary | ICD-10-CM | POA: Insufficient documentation

## 2018-01-29 DIAGNOSIS — B351 Tinea unguium: Secondary | ICD-10-CM | POA: Diagnosis not present

## 2018-01-29 DIAGNOSIS — M179 Osteoarthritis of knee, unspecified: Secondary | ICD-10-CM | POA: Insufficient documentation

## 2018-01-29 DIAGNOSIS — M79675 Pain in left toe(s): Secondary | ICD-10-CM | POA: Diagnosis not present

## 2018-01-29 DIAGNOSIS — E119 Type 2 diabetes mellitus without complications: Secondary | ICD-10-CM

## 2018-01-29 DIAGNOSIS — E669 Obesity, unspecified: Secondary | ICD-10-CM | POA: Insufficient documentation

## 2018-01-29 DIAGNOSIS — E559 Vitamin D deficiency, unspecified: Secondary | ICD-10-CM | POA: Insufficient documentation

## 2018-01-29 DIAGNOSIS — M810 Age-related osteoporosis without current pathological fracture: Secondary | ICD-10-CM | POA: Insufficient documentation

## 2018-01-29 DIAGNOSIS — I10 Essential (primary) hypertension: Secondary | ICD-10-CM | POA: Insufficient documentation

## 2018-01-29 NOTE — Patient Instructions (Signed)

## 2018-02-16 ENCOUNTER — Encounter: Payer: Self-pay | Admitting: Podiatry

## 2018-02-16 NOTE — Progress Notes (Signed)
Subjective: April Gallagher presents today with painful, thick toenails 1-5 b/l that she cannot cut and which interfere with daily activities.  Pain is aggravated when wearing enclosed shoe gear.  Patient also has painful porokeratosis b/l which are responsive to routine debridement.  She voices no new problems on today's visit.  Willey Blade, MD is her PCP.   Current Outpatient Medications:  .  amLODipine (NORVASC) 10 MG tablet, Take 10 mg by mouth daily., Disp: , Rfl:  .  atorvastatin (LIPITOR) 40 MG tablet, Take 40 mg by mouth at bedtime. , Disp: , Rfl:  .  brimonidine (ALPHAGAN) 0.15 % ophthalmic solution, Place 1 drop into the right eye 2 (two) times daily. , Disp: , Rfl:  .  calcium carbonate (OS-CAL) 600 MG TABS tablet, Take 600 mg by mouth every morning. , Disp: , Rfl:  .  Coenzyme Q10 (CO Q 10 PO), Take 1 tablet by mouth daily., Disp: , Rfl:  .  Flaxseed, Linseed, (FLAXSEED OIL PO), Take 1 tablet by mouth daily. , Disp: , Rfl:  .  GARLIC PO, Take 1 tablet by mouth daily. , Disp: , Rfl:  .  glucosamine-chondroitin 500-400 MG tablet, Take 1 tablet by mouth daily., Disp: , Rfl:  .  latanoprost (XALATAN) 0.005 % ophthalmic solution, Place 1 drop into the right eye at bedtime. , Disp: , Rfl:  .  meclizine (ANTIVERT) 25 MG tablet, meclizine 25 mg tablet, Disp: , Rfl:  .  metFORMIN (GLUCOPHAGE) 500 MG tablet, Take 500 mg by mouth 2 (two) times daily with a meal. , Disp: , Rfl:   Allergies  Allergen Reactions  . Other     Objective:  Vascular Examination: Capillary refill time immediate x 10 digits  Dorsalis pedis and Posterior tibial pulses palpable b/l  Digital hair present x 10 digits  Skin temperature gradient WNL b/l  Dermatological Examination: Skin with normal turgor, texture and tone b/l  Toenails 1-5 b/l discolored, thick, dystrophic with subungual debris and pain with palpation to nailbeds due to thickness of nails.  Porokeratotic lesion submet head 2, 5 right  foot and submet head 1, 2 left foot  Musculoskeletal: Muscle strength 5/5 to all LE muscle groups  No gross bony deformities b/l.  No pain, crepitus or joint limitation noted with ROM.   Neurological: Sensation intact with 10 gram monofilament. Vibratory sensation intact.  Assessment: Painful onychomycosis toenails 1-5 b/l  Painful porokeratoses submet head 2, 5 right foot and submet head 1, 2 left foot NIDDM  Plan: 1. Toenails 1-5 b/l were debrided in length and girth without iatrogenic bleeding. 2. Porokeratotic lesions pared and enucleated submet head 2, 5 right foot and submet head 1, 2 left foot without incident using sterile scalpel. 3. Patient to continue soft, supportive shoe gear 4. Patient to report any pedal injuries to medical professional immediately. 5. Follow up 3 months. Patient/POA to call should there be a concern in the interim.

## 2018-05-27 ENCOUNTER — Ambulatory Visit: Payer: Medicare Other | Admitting: Podiatry

## 2018-08-15 ENCOUNTER — Other Ambulatory Visit: Payer: Self-pay | Admitting: Internal Medicine

## 2018-08-15 DIAGNOSIS — Z1231 Encounter for screening mammogram for malignant neoplasm of breast: Secondary | ICD-10-CM

## 2018-08-18 ENCOUNTER — Other Ambulatory Visit: Payer: Self-pay | Admitting: Internal Medicine

## 2018-08-18 DIAGNOSIS — N6320 Unspecified lump in the left breast, unspecified quadrant: Secondary | ICD-10-CM

## 2018-08-19 ENCOUNTER — Ambulatory Visit: Payer: Medicare Other | Admitting: Podiatry

## 2018-08-19 ENCOUNTER — Other Ambulatory Visit: Payer: Self-pay

## 2018-08-19 ENCOUNTER — Encounter: Payer: Self-pay | Admitting: Podiatry

## 2018-08-19 VITALS — Temp 97.2°F

## 2018-08-19 DIAGNOSIS — Q828 Other specified congenital malformations of skin: Secondary | ICD-10-CM

## 2018-08-19 DIAGNOSIS — M79674 Pain in right toe(s): Secondary | ICD-10-CM

## 2018-08-19 DIAGNOSIS — E119 Type 2 diabetes mellitus without complications: Secondary | ICD-10-CM | POA: Diagnosis not present

## 2018-08-19 DIAGNOSIS — B351 Tinea unguium: Secondary | ICD-10-CM

## 2018-08-19 DIAGNOSIS — M79675 Pain in left toe(s): Secondary | ICD-10-CM

## 2018-08-19 NOTE — Progress Notes (Signed)
Patient ID: April Gallagher, female   DOB: 13-Nov-1926, 85 y.o.   MRN: [id number] Complaint:  Visit Type: Patient returns to my office for continued preventative foot care services. Complaint: Patient states" my nails have grown long and thick and become painful to walk and wear shoes" Patient has been diagnosed with DM with no foot complications. The patient presents for preventative foot care services. No changes to ROS.  Painful callus both feet.    Podiatric Exam: Vascular: dorsalis pedis and posterior tibial pulses are palpable bilateral. Capillary return is immediate. Temperature gradient is WNL. Skin turgor WNL  Sensorium: Normal Semmes Weinstein monofilament test. Normal tactile sensation bilaterally. Nail Exam: Pt has thick disfigured discolored nails with subungual debris noted bilateral entire nail hallux through fifth toenails Ulcer Exam: There is no evidence of ulcer or pre-ulcerative changes or infection. Orthopedic Exam: Muscle tone and strength are WNL. No limitations in general ROM. No crepitus or effusions noted. Foot type and digits show no abnormalities. Bony prominences are unremarkable. Skin: No Porokeratosis. No infection or ulcers.Porokeratosis sub 1 left foot and sub 1,2, and 5 right foot.  Diagnosis:  Onychomycosis, , Pain in right toe, pain porokeratosis  B/L  Treatment & Plan Procedures and Treatment: Consent by patient was obtained for treatment procedures. The patient understood the discussion of treatment and procedures well. All questions were answered thoroughly reviewed. Debridement of mycotic and hypertrophic toenails, 1 through 5 bilateral and clearing of subungual debris. No ulceration, no infection noted. Debride porokeratosis.   Return Visit-Office Procedure: Patient instructed to return to the office for a follow up visit 4  months for continued evaluation and treatment.   Gardiner Barefoot DPM

## 2018-08-22 ENCOUNTER — Ambulatory Visit
Admission: RE | Admit: 2018-08-22 | Discharge: 2018-08-22 | Disposition: A | Discharge status: Home / Self Care | Payer: Medicare Other | Source: Ambulatory Visit | Attending: Internal Medicine | Admitting: Internal Medicine

## 2018-08-22 ENCOUNTER — Other Ambulatory Visit: Payer: Self-pay | Admitting: Internal Medicine

## 2018-08-22 ENCOUNTER — Other Ambulatory Visit: Payer: Self-pay

## 2018-08-22 DIAGNOSIS — N6320 Unspecified lump in the left breast, unspecified quadrant: Secondary | ICD-10-CM

## 2018-08-22 DIAGNOSIS — R599 Enlarged lymph nodes, unspecified: Secondary | ICD-10-CM

## 2018-08-26 ENCOUNTER — Other Ambulatory Visit: Payer: Self-pay

## 2018-08-26 ENCOUNTER — Ambulatory Visit
Admission: RE | Admit: 2018-08-26 | Discharge: 2018-08-26 | Disposition: A | Discharge status: Home / Self Care | Payer: Medicare Other | Source: Ambulatory Visit | Attending: Internal Medicine | Admitting: Internal Medicine

## 2018-08-26 DIAGNOSIS — N6320 Unspecified lump in the left breast, unspecified quadrant: Secondary | ICD-10-CM

## 2018-08-26 DIAGNOSIS — R599 Enlarged lymph nodes, unspecified: Secondary | ICD-10-CM

## 2018-08-27 ENCOUNTER — Telehealth: Payer: Self-pay | Admitting: Hematology and Oncology
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
## 2018-08-29 ENCOUNTER — Telehealth: Payer: Self-pay | Admitting: *Deleted

## 2018-08-29 NOTE — Telephone Encounter (Signed)
I received a call from the [facility] concerning patient who is scheduled as a new patient to see Dr. Lindi Adie.  Patient's daughter had called with a concern about not being able to accompany her mother for her visit during current restrictions.  Daughter stated that patient is 85 years old with a 4th grade education and in need of her daughter's assistance.  I spoke to Dr. Lindi Adie and Dr. Julien Nordmann who approved patient's daughter to accompany her for her appointment.  I called daughter and informed her and had note added to appointment information.

## 2018-09-01 NOTE — Progress Notes (Signed)
[place] CONSULT NOTE  Patient Care Team: Willey Blade, MD as PCP - General (Internal Medicine)  CHIEF COMPLAINTS/PURPOSE OF CONSULTATION:  Newly diagnosed breast cancer  HISTORY OF PRESENTING ILLNESS:  April Gallagher 85 y.o. female is here because of recent diagnosis of invasive mammary carcinoma of the left breast. The patient palpated a lump in her left breast on self exam. Diagnostic mammogram and [place] on 08/22/18 showed a 4.2cm mass in the upper inner left breast at the 11 o'clock position with 4 enlarged axillary lymph nodes. Biopsy on 08/26/18 showed invasive mammary carcinoma, grade 1, HER-2 negative (1+), ER 100%, PR 0%, Ki67 15% in the left breast and axilla. She presents to the clinic today for initial evaluation and discussion of treatment options.  Patient is here today accompanied by her daughter.  She is able to walk with the help of a walker.  She appeared to be very anxious and nervous today.  She has hearing impairment.  She does not have any pain in the breast.  I reviewed her records extensively and collaborated the history with the patient.  SUMMARY OF ONCOLOGIC HISTORY: Oncology History  Malignant neoplasm of upper-inner quadrant of left breast in female, estrogen receptor positive ([place])  08/26/2018 Initial Diagnosis   Palpable mass 4.2cm in the upper inner left breast at the 11 o'clock position with 4 enlarged axillary lymph nodes. Biopsy on 08/26/18 showed invasive mammary carcinoma, grade 1, HER-2 negative (1+), ER 100%, PR 0%, Ki67 15% in the left breast and axilla.     MEDICAL HISTORY:  Past Medical History:  Diagnosis Date  . Arthritis   . Diabetes mellitus without complication ([place])   . Hypertension     SURGICAL HISTORY: Past Surgical History:  Procedure Laterality Date  . BREAST BIOPSY    . BREAST EXCISIONAL BIOPSY Right     SOCIAL HISTORY: Social History   Socioeconomic History  . Marital status: Widowed    Spouse name: Not on  file  . Number of children: Not on file  . Years of education: Not on file  . Highest education level: Not on file  Occupational History  . Not on file  Social Needs  . Financial resource strain: Not on file  . Food insecurity    Worry: Not on file    Inability: Not on file  . Transportation needs    Medical: Not on file    Non-medical: Not on file  Tobacco Use  . Smoking status: Never Smoker  . Smokeless tobacco: Never Used  Substance and Sexual Activity  . Alcohol use: No  . Drug use: Not on file  . Sexual activity: Not on file  Lifestyle  . Physical activity    Days per week: Not on file    Minutes per session: Not on file  . Stress: Not on file  Relationships  . Social Herbalist on phone: Not on file    Gets together: Not on file    Attends religious service: Not on file    Active member of club or organization: Not on file    Attends meetings of clubs or organizations: Not on file    Relationship status: Not on file  . Intimate partner violence    Fear of current or ex partner: Not on file    Emotionally abused: Not on file    Physically abused: Not on file    Forced sexual activity: Not on file  Other Topics Concern  .  Not on file  Social History Narrative  . Not on file    FAMILY HISTORY: No family history on file.  ALLERGIES:  is allergic to other.  MEDICATIONS:  Current Outpatient Medications  Medication Sig Dispense Refill  . ACCU-CHEK SMARTVIEW test strip     . amLODipine (NORVASC) 10 MG tablet Take 10 mg by mouth daily.    Marland Kitchen anastrozole (ARIMIDEX) 1 MG tablet Take 1 tablet (1 mg total) by mouth daily. 90 tablet 3  . atorvastatin (LIPITOR) 40 MG tablet Take 40 mg by mouth at bedtime.     . brimonidine (ALPHAGAN) 0.2 % ophthalmic solution INSTILL 1 DROP INTO RIGHT EYE TWICE DAILY    . calcium carbonate (OS-CAL) 600 MG TABS tablet Take 600 mg by mouth every morning.     . Coenzyme Q10 (CO Q 10 PO) Take 1 tablet by mouth daily.    .  Flaxseed, Linseed, (FLAXSEED OIL PO) Take 1 tablet by mouth daily.     Marland Kitchen GARLIC PO Take 1 tablet by mouth daily.     Marland Kitchen glucosamine-chondroitin 500-400 MG tablet Take 1 tablet by mouth daily.    Marland Kitchen latanoprost (XALATAN) 0.005 % ophthalmic solution Place 1 drop into the right eye at bedtime.     . meclizine (ANTIVERT) 25 MG tablet meclizine 25 mg tablet    . metFORMIN (GLUCOPHAGE) 500 MG tablet Take 500 mg by mouth 2 (two) times daily with a meal.     . UNABLE TO FIND Place 1 drop into the right eye nightly.     No current facility-administered medications for this visit.     REVIEW OF SYSTEMS:   Constitutional: Denies fevers, chills or abnormal night sweats Eyes: Denies blurriness of vision, double vision or watery eyes Ears, nose, mouth, throat, and face: Denies mucositis or sore throat Respiratory: Denies cough, dyspnea or wheezes Cardiovascular: Denies palpitation, chest discomfort or lower extremity swelling Gastrointestinal:  Denies nausea, heartburn or change in bowel habits Skin: Denies abnormal skin rashes Lymphatics: Denies new lymphadenopathy or easy bruising Neurological:Denies numbness, tingling or new weaknesses Behavioral/Psych: Mood is stable, no new changes  Breast: Palpable left breast mass All other systems were reviewed with the patient and are negative.  PHYSICAL EXAMINATION: ECOG PERFORMANCE STATUS: 3 - Symptomatic, >50% confined to bed  Vitals:   09/02/18 1547  BP: (!) 154/68  Pulse: 76  Resp: 18  Temp: 98.3 F (36.8 C)  SpO2: 98%   Filed Weights   09/02/18 1547  Weight: 168 lb (76.2 kg)    GENERAL:alert, no distress and comfortable SKIN: skin color, texture, turgor are normal, no rashes or significant lesions EYES: normal, conjunctiva are pink and non-injected, sclera clear OROPHARYNX:no exudate, no erythema and lips, buccal mucosa, and tongue normal  NECK: supple, thyroid normal size, non-tender, without nodularity LYMPH:  no palpable  lymphadenopathy in the cervical, axillary or inguinal LUNGS: clear to auscultation and percussion with normal breathing effort HEART: regular rate & rhythm and no murmurs and no lower extremity edema ABDOMEN:abdomen soft, non-tender and normal bowel sounds Musculoskeletal:no cyanosis of digits and no clubbing  PSYCH: alert & oriented x 3 with fluent speech NEURO: no focal motor/sensory deficits Breast: Palpable lump in the left breast.  LABORATORY DATA:  I have reviewed the data as listed Lab Results  Component Value Date   WBC 4.3 06/03/2009   HGB 11.5 (L) 06/03/2009   HCT 35.3 (L) 06/03/2009   MCV 89.5 06/03/2009   PLT 175 06/03/2009   Lab  Results  Component Value Date   NA 138 06/03/2009   K 4.0 06/03/2009   CL 105 06/03/2009   CO2 27 06/03/2009    RADIOGRAPHIC STUDIES: I have personally reviewed the radiological reports and agreed with the findings in the report.  ASSESSMENT AND PLAN:  Malignant neoplasm of upper-inner quadrant of left breast in female, estrogen receptor positive ([place]) 08/26/2018:Palpable mass 4.2cm in the upper inner left breast at the 11 o'clock position with 4 enlarged axillary lymph nodes. Biopsy on 08/26/18 showed invasive lobular carcinoma, grade 1-2, HER-2 negative (1+), ER 100%, PR 0%, Ki67 15% in the left breast and axilla. T2N1 stage IIa  Pathology and radiology counseling: Discussed with the patient, the details of pathology including the type of breast cancer,the clinical staging, the significance of ER, PR and HER-2/neu receptors and the implications for treatment. After reviewing the pathology in detail, we proceeded to discuss the different treatment options between surgery, radiation, chemotherapy, antiestrogen therapies.  Recommendation: 1.  Based on her age and her health issues, I recommended palliative treatment with antiestrogen therapy with anastrozole. 2.  Patient wants to know more about surgical options so we will request an  appointment with Dr. Dalbert Batman. 3.  CT chest abdomen pelvis with contrast will be obtained to assess metastatic disease because she has back pain issues.  Return to clinic in 1 month for follow-up to assess tolerance to antiestrogen therapy.  All questions were answered. The patient knows to call the clinic with any problems, questions or concerns.   Rulon Eisenmenger, MD 09/02/2018    I, Molly Dorshimer, am acting as scribe for Nicholas Lose, MD.  I have reviewed the above documentation for accuracy and completeness, and I agree with the above.

## 2018-09-02 ENCOUNTER — Inpatient Hospital Stay: Payer: Medicare Other | Attending: Hematology and Oncology | Admitting: Hematology and Oncology

## 2018-09-02 ENCOUNTER — Other Ambulatory Visit: Payer: Self-pay

## 2018-09-02 DIAGNOSIS — R590 Localized enlarged lymph nodes: Secondary | ICD-10-CM

## 2018-09-02 DIAGNOSIS — Z7984 Long term (current) use of oral hypoglycemic drugs: Secondary | ICD-10-CM | POA: Diagnosis not present

## 2018-09-02 DIAGNOSIS — Z170 Estrogen receptor positive status [ER+]: Secondary | ICD-10-CM | POA: Diagnosis not present

## 2018-09-02 DIAGNOSIS — M549 Dorsalgia, unspecified: Secondary | ICD-10-CM | POA: Diagnosis not present

## 2018-09-02 DIAGNOSIS — C50212 Malignant neoplasm of upper-inner quadrant of left female breast: Secondary | ICD-10-CM | POA: Insufficient documentation

## 2018-09-02 DIAGNOSIS — Z79899 Other long term (current) drug therapy: Secondary | ICD-10-CM | POA: Diagnosis not present

## 2018-09-02 DIAGNOSIS — I10 Essential (primary) hypertension: Secondary | ICD-10-CM | POA: Insufficient documentation

## 2018-09-02 DIAGNOSIS — M1990 Unspecified osteoarthritis, unspecified site: Secondary | ICD-10-CM | POA: Insufficient documentation

## 2018-09-02 DIAGNOSIS — E119 Type 2 diabetes mellitus without complications: Secondary | ICD-10-CM

## 2018-09-02 MED ORDER — ANASTROZOLE 1 MG PO TABS: 1.0000 mg | ORAL_TABLET | Freq: Every day | ORAL | 3 refills | Status: DC

## 2018-09-02 NOTE — Assessment & Plan Note (Signed)
08/26/2018:Palpable mass 4.2cm in the upper inner left breast at the 11 o'clock position with 4 enlarged axillary lymph nodes. Biopsy on 08/26/18 showed invasive mammary carcinoma, grade 1, HER-2 negative (1+), ER 100%, PR 0%, Ki67 15% in the left breast and axilla.  Pathology and radiology counseling: Discussed with the patient, the details of pathology including the type of breast cancer,the clinical staging, the significance of ER, PR and HER-2/neu receptors and the implications for treatment. After reviewing the pathology in detail, we proceeded to discuss the different treatment options between surgery, radiation, chemotherapy, antiestrogen therapies.  Recommendation: 1.  Based on her age and her health issues, I recommended palliative treatment with antiestrogen therapy with anastrozole. 2.  Patient wants to know more about surgical options so we will request an appointment with Dr. Dalbert Batman. 3.  CT chest abdomen pelvis with contrast will be obtained to assess metastatic disease because she has back pain issues.  Return to clinic in 1 month for follow-up to assess tolerance to antiestrogen therapy.

## 2018-09-04 ENCOUNTER — Telehealth: Payer: Self-pay | Admitting: Hematology and Oncology

## 2018-09-04 NOTE — Telephone Encounter (Signed)
I talk with patient daughter °

## 2018-09-08 ENCOUNTER — Inpatient Hospital Stay: Payer: Medicare Other

## 2018-09-08 ENCOUNTER — Other Ambulatory Visit: Payer: Self-pay | Admitting: *Deleted

## 2018-09-08 ENCOUNTER — Telehealth: Payer: Self-pay | Admitting: *Deleted

## 2018-09-08 ENCOUNTER — Other Ambulatory Visit: Payer: Self-pay

## 2018-09-08 DIAGNOSIS — Z170 Estrogen receptor positive status [ER+]: Secondary | ICD-10-CM

## 2018-09-08 DIAGNOSIS — C50212 Malignant neoplasm of upper-inner quadrant of left female breast: Secondary | ICD-10-CM | POA: Diagnosis not present

## 2018-09-08 LAB — CBC WITH DIFFERENTIAL (CANCER CENTER ONLY)
Abs Immature Granulocytes: 0.01 10*3/uL (ref 0.00–0.07)
Basophils Absolute: 0 10*3/uL (ref 0.0–0.1)
Basophils Relative: 0 %
Eosinophils Absolute: 0.1 10*3/uL (ref 0.0–0.5)
Eosinophils Relative: 2 %
HCT: 35.4 % — ABNORMAL LOW (ref 36.0–46.0)
Hemoglobin: 11.1 g/dL — ABNORMAL LOW (ref 12.0–15.0)
Immature Granulocytes: 0 %
Lymphocytes Relative: 38 %
Lymphs Abs: 1.6 10*3/uL (ref 0.7–4.0)
MCH: 29.4 pg (ref 26.0–34.0)
MCHC: 31.4 g/dL (ref 30.0–36.0)
MCV: 93.7 fL (ref 80.0–100.0)
Monocytes Absolute: 0.4 10*3/uL (ref 0.1–1.0)
Monocytes Relative: 9 %
Neutro Abs: 2.1 10*3/uL (ref 1.7–7.7)
Neutrophils Relative %: 51 %
Platelet Count: 179 10*3/uL (ref 150–400)
RBC: 3.78 MIL/uL — ABNORMAL LOW (ref 3.87–5.11)
RDW: 13 % (ref 11.5–15.5)
WBC Count: 4.2 10*3/uL (ref 4.0–10.5)
nRBC: 0 % (ref 0.0–0.2)

## 2018-09-08 LAB — CMP (CANCER CENTER ONLY)
ALT: 9 U/L (ref 0–44)
AST: 12 U/L — ABNORMAL LOW (ref 15–41)
Albumin: 4.1 g/dL (ref 3.5–5.0)
Alkaline Phosphatase: 61 U/L (ref 38–126)
Anion gap: 9 (ref 5–15)
BUN: 10 mg/dL (ref 8–23)
CO2: 27 mmol/L (ref 22–32)
Calcium: 10 mg/dL (ref 8.9–10.3)
Chloride: 106 mmol/L (ref 98–111)
Creatinine: 0.75 mg/dL (ref 0.44–1.00)
GFR, Est AFR Am: >60 mL/min (ref >60)
GFR, Estimated: >60 mL/min (ref >60)
Glucose, Bld: 89 mg/dL (ref 70–99)
Potassium: 3.9 mmol/L (ref 3.5–5.1)
Sodium: 142 mmol/L (ref 135–145)
Total Bilirubin: 0.5 mg/dL (ref 0.3–1.2)
Total Protein: 7.5 g/dL (ref 6.5–8.1)

## 2018-09-08 NOTE — Telephone Encounter (Signed)
Connected with Nolene Bernheim identified with two patient identifiers to answer questions, assess further needs.     "I have the contrast.  Concerned about the diarrhea it causes is why Dr. Geralyn Flash nurse Merleen Nicely changed lab appointment to today at 12:45 pm."  Expressed there may be mild GI side effects of the contrast of diarrhea or constipation.  Some patients report stomach cramps, nausea, or vomiting.

## 2018-09-08 NOTE — Telephone Encounter (Signed)
"  Tedra Coupe, Radiology CT.  SREYA SNUFFER concerned about effect of scans on bowels and would like to come in today for blood work.  Scheduling can call her at [phone]."  Scheduling message sent.

## 2018-09-09 ENCOUNTER — Ambulatory Visit (HOSPITAL_COMMUNITY)
Admission: RE | Admit: 2018-09-09 | Discharge: 2018-09-09 | Disposition: A | Discharge status: Home / Self Care | Payer: Medicare Other | Source: Ambulatory Visit | Attending: Hematology and Oncology | Admitting: Hematology and Oncology

## 2018-09-09 ENCOUNTER — Other Ambulatory Visit: Payer: Medicare Other

## 2018-09-09 DIAGNOSIS — Z170 Estrogen receptor positive status [ER+]: Secondary | ICD-10-CM | POA: Insufficient documentation

## 2018-09-09 DIAGNOSIS — C50212 Malignant neoplasm of upper-inner quadrant of left female breast: Secondary | ICD-10-CM | POA: Diagnosis not present

## 2018-09-09 MED ORDER — SODIUM CHLORIDE (PF) 0.9 % IJ SOLN
INTRAMUSCULAR | Status: AC
  Filled 2018-09-09: qty 50

## 2018-09-09 MED ORDER — IOHEXOL 300 MG/ML  SOLN
100.0000 mL | Freq: Once | INTRAMUSCULAR | Status: AC | PRN
  Administered 2018-09-09: 100 mL via INTRAVENOUS

## 2018-09-10 ENCOUNTER — Telehealth: Payer: Self-pay | Admitting: Hematology and Oncology

## 2018-09-10 NOTE — Telephone Encounter (Signed)
I spoke with patient's power of attorney Alice and informed her that the CT scans showed the breast cancer and the lymphadenopathy in the axilla and the subpectoral areas.  In addition to this she had a left kidney mass measuring 4.5 x 3.5 cm.  There were some calcifications in the mass. It is possible that she may have a renal cell cancer in addition but based on her advanced age we are not recommending any biopsies or surgical procedures. Danton Clap was in agreement with the plan.

## 2018-09-19 ENCOUNTER — Telehealth: Payer: Self-pay

## 2018-09-19 NOTE — Telephone Encounter (Signed)
RN spoke with patient's POA, Alice.  Per Danton Clap, pt was instructed to stop taking arthritis strength tylenol prior to biopsy.  Danton Clap is questioning if it is OK for her mom to resume.  Per Alice, no abnormal bruising or bleeding to patient prior to or since biopsy.    RN verbalized safety of patient utilizing Arthritis Strength Tylenol.  Encouraged to monitor for any bruising or bleeding.  Verbalized understanding.

## 2018-10-02 NOTE — Progress Notes (Signed)
Patient Care Team: Willey Blade, MD as PCP - General (Internal Medicine)  DIAGNOSIS:    ICD-10-CM   1. Malignant neoplasm of upper-inner quadrant of left breast in female, estrogen receptor positive ([place])  C50.212    Z17.0     SUMMARY OF ONCOLOGIC HISTORY: Oncology History  Malignant neoplasm of upper-inner quadrant of left breast in female, estrogen receptor positive ([place])  08/26/2018 Initial Diagnosis   Palpable mass 4.2cm in the upper inner left breast at the 11 o'clock position with 4 enlarged axillary lymph nodes. Biopsy on 08/26/18 showed invasive mammary carcinoma, grade 1, HER-2 negative (1+), ER 100%, PR 0%, Ki67 15% in the left breast and axilla.   08/26/2018 Cancer Staging   Staging form: Breast, AJCC 8th Edition - Clinical stage from 08/26/2018: Stage IIB (cT2, cN1, cM0, G1, ER+, PR-, HER2-) - Signed by [name], NP on 09/03/2018   09/02/2018 -  Anti-estrogen oral therapy   Palliative anti-estrogen therapy with anastrozole      CHIEF COMPLIANT: Follow-up of left breat cancer on anastrozole  INTERVAL HISTORY: April Gallagher is a 85 y.o. with above-mentioned history of left breast cancer who is currently on palliative treatment with antiestrogen therapy with anastrozole. CT CAP on 09/09/18 showed the left breast and axilla masses and a lower pole left renal mass suspicious for renal cell carcinoma. She presents to the clinic today for follow-up.   REVIEW OF SYSTEMS:   Constitutional: Denies fevers, chills or abnormal weight loss Eyes: Denies blurriness of vision Ears, nose, mouth, throat, and face: Denies mucositis or sore throat Respiratory: Denies cough, dyspnea or wheezes Cardiovascular: Denies palpitation, chest discomfort Gastrointestinal: Denies nausea, heartburn or change in bowel habits Skin: Denies abnormal skin rashes Lymphatics: Denies new lymphadenopathy or easy bruising Neurological: Denies numbness, tingling or new weaknesses  Behavioral/Psych: Mood is stable, no new changes  Extremities: No lower extremity edema Breast: denies any pain or lumps or nodules in either breasts All other systems were reviewed with the patient and are negative.  I have reviewed the past medical history, past surgical history, social history and family history with the patient and they are unchanged from previous note.  ALLERGIES:  is allergic to other.  MEDICATIONS:  Current Outpatient Medications  Medication Sig Dispense Refill  . ACCU-CHEK SMARTVIEW test strip     . amLODipine (NORVASC) 10 MG tablet Take 10 mg by mouth daily.    Marland Kitchen anastrozole (ARIMIDEX) 1 MG tablet Take 1 tablet (1 mg total) by mouth daily. 90 tablet 3  . atorvastatin (LIPITOR) 40 MG tablet Take 40 mg by mouth at bedtime.     . brimonidine (ALPHAGAN) 0.2 % ophthalmic solution INSTILL 1 DROP INTO RIGHT EYE TWICE DAILY    . calcium carbonate (OS-CAL) 600 MG TABS tablet Take 600 mg by mouth every morning.     . Coenzyme Q10 (CO Q 10 PO) Take 1 tablet by mouth daily.    . Flaxseed, Linseed, (FLAXSEED OIL PO) Take 1 tablet by mouth daily.     Marland Kitchen GARLIC PO Take 1 tablet by mouth daily.     Marland Kitchen glucosamine-chondroitin 500-400 MG tablet Take 1 tablet by mouth daily.    Marland Kitchen latanoprost (XALATAN) 0.005 % ophthalmic solution Place 1 drop into the right eye at bedtime.     . meclizine (ANTIVERT) 25 MG tablet meclizine 25 mg tablet    . metFORMIN (GLUCOPHAGE) 500 MG tablet Take 500 mg by mouth 2 (two) times daily with a meal.     .  UNABLE TO FIND Place 1 drop into the right eye nightly.     No current facility-administered medications for this visit.     PHYSICAL EXAMINATION: ECOG PERFORMANCE STATUS: 1 - Symptomatic but completely ambulatory  Vitals:   10/03/18 1142  BP: 135/70  Pulse: 87  Resp: 17  Temp: 98 F (36.7 C)  SpO2: 99%   Filed Weights   10/03/18 1142  Weight: 168 lb 3.2 oz (76.3 kg)    GENERAL: alert, no distress and comfortable SKIN: skin color,  texture, turgor are normal, no rashes or significant lesions EYES: normal, Conjunctiva are pink and non-injected, sclera clear OROPHARYNX: no exudate, no erythema and lips, buccal mucosa, and tongue normal  NECK: supple, thyroid normal size, non-tender, without nodularity LYMPH: no palpable lymphadenopathy in the cervical, axillary or inguinal LUNGS: clear to auscultation and percussion with normal breathing effort HEART: regular rate & rhythm and no murmurs and no lower extremity edema ABDOMEN: abdomen soft, non-tender and normal bowel sounds MUSCULOSKELETAL: no cyanosis of digits and no clubbing  NEURO: alert & oriented x 3 with fluent speech, no focal motor/sensory deficits EXTREMITIES: No lower extremity edema  LABORATORY DATA:  I have reviewed the data as listed CMP Latest Ref Rng & Units 09/08/2018 06/03/2009  Glucose 70 - 99 mg/dL 89 146(H)  BUN 8 - 23 mg/dL 10 8  Creatinine 0.44 - 1.00 mg/dL 0.75 0.63  Sodium 135 - 145 mmol/L 142 138  Potassium 3.5 - 5.1 mmol/L 3.9 4.0  Chloride 98 - 111 mmol/L 106 105  CO2 22 - 32 mmol/L 27 27  Calcium 8.9 - 10.3 mg/dL 10.0 9.2  Total Protein 6.5 - 8.1 g/dL 7.5 7.3  Total Bilirubin 0.3 - 1.2 mg/dL 0.5 0.5  Alkaline Phos 38 - 126 U/L 61 43  AST 15 - 41 U/L 12(L) 17  ALT 0 - 44 U/L 9 13    Lab Results  Component Value Date   WBC 4.2 09/08/2018   HGB 11.1 (L) 09/08/2018   HCT 35.4 (L) 09/08/2018   MCV 93.7 09/08/2018   PLT 179 09/08/2018   NEUTROABS 2.1 09/08/2018    ASSESSMENT & PLAN:  Malignant neoplasm of upper-inner quadrant of left breast in female, estrogen receptor positive ([facility]) 08/26/2018:Palpable mass 4.2cm in the upper inner left breast at the 11 o'clock position with 4 enlarged axillary lymph nodes. Biopsy on 08/26/18 showed invasive lobular carcinoma, grade 1-2, HER-2 negative (1+), ER 100%, PR 0%, Ki67 15% in the left breast and axilla. T2N1 stage IIa  CT scans showed the breast cancer and the lymphadenopathy in the axilla  and the subpectoral areas.  In addition to this she had a left kidney mass measuring 4.5 x 3.5 cm.  Could be a primary renal cell cancer.  But given her age and health issues no surgical approaches are planned.  Current treatment: Palliative treatment with anastrozole 1 mg daily Surgical appointment with Dr. Dalbert Batman: He did not recommend surgery.  Anastrozole toxicities: Denies any adverse effects to anastrozole therapy. She is very independent and wants to be as active as possible.  She loves gardening and wants to do heavy lifting as well.  I instructed her to be careful and avoid unnecessary injuries. Return to clinic in 6 months for follow-up with a mammogram and ultrasound on the left breast.    No orders of the defined types were placed in this encounter.  The patient has a good understanding of the overall plan. she agrees with it. she will  call with any problems that may develop before the next visit here.  Nicholas Lose, MD 10/03/2018  Julious Oka Dorshimer am acting as scribe for Dr. Nicholas Lose.  I have reviewed the above documentation for accuracy and completeness, and I agree with the above.

## 2018-10-03 ENCOUNTER — Inpatient Hospital Stay: Payer: Medicare Other | Attending: Hematology and Oncology | Admitting: Hematology and Oncology

## 2018-10-03 ENCOUNTER — Other Ambulatory Visit: Payer: Self-pay

## 2018-10-03 DIAGNOSIS — Z79899 Other long term (current) drug therapy: Secondary | ICD-10-CM | POA: Insufficient documentation

## 2018-10-03 DIAGNOSIS — C773 Secondary and unspecified malignant neoplasm of axilla and upper limb lymph nodes: Secondary | ICD-10-CM | POA: Diagnosis not present

## 2018-10-03 DIAGNOSIS — C50212 Malignant neoplasm of upper-inner quadrant of left female breast: Secondary | ICD-10-CM | POA: Insufficient documentation

## 2018-10-03 DIAGNOSIS — Z79811 Long term (current) use of aromatase inhibitors: Secondary | ICD-10-CM | POA: Insufficient documentation

## 2018-10-03 DIAGNOSIS — Z7984 Long term (current) use of oral hypoglycemic drugs: Secondary | ICD-10-CM | POA: Insufficient documentation

## 2018-10-03 DIAGNOSIS — Z170 Estrogen receptor positive status [ER+]: Secondary | ICD-10-CM

## 2018-10-03 NOTE — Assessment & Plan Note (Signed)
08/26/2018:Palpable mass 4.2cm in the upper inner left breast at the 11 o'clock position with 4 enlarged axillary lymph nodes. Biopsy on 08/26/18 showed invasive lobular carcinoma, grade 1-2, HER-2 negative (1+), ER 100%, PR 0%, Ki67 15% in the left breast and axilla. T2N1 stage IIa  CT scans showed the breast cancer and the lymphadenopathy in the axilla and the subpectoral areas.  In addition to this she had a left kidney mass measuring 4.5 x 3.5 cm.  Could be a primary renal cell cancer.  But given her age and health issues no surgical approaches are planned.  Current treatment: Palliative treatment with anastrozole 1 mg daily Surgical appointment with Dr. Dalbert Batman  Anastrozole toxicities:  Return to clinic in 3 months for follow-up

## 2018-10-06 ENCOUNTER — Telehealth: Payer: Self-pay | Admitting: Hematology and Oncology

## 2018-10-06 NOTE — Telephone Encounter (Signed)
I talk with patients son inlaw regarding schedule

## 2018-11-21 ENCOUNTER — Telehealth: Payer: Self-pay

## 2018-11-21 NOTE — Telephone Encounter (Signed)
Pt's daughter, Danton Clap [facility]), called to inform that pt's Cancer Policy was denied due to needing a "UB Form" per daughter.   RN notified daughter, message would be sent to billing contacts to inquire obtaining form.    RN sent message to billing staff.  Pt's daughter will call later next week if she has not heard from anyone.

## 2018-12-03 ENCOUNTER — Telehealth: Payer: Self-pay | Admitting: *Deleted

## 2018-12-03 NOTE — Telephone Encounter (Signed)
Received call from pt daughter Danton Clap [facility]) requesting an update on her conversation with RN on 11/21/18 regarding a "UB form" being filled out for the patients cancer policy.  RN Museum/gallery exhibitions officer with billing regarding this situation.  Per Juliann Pulse pt daughter need to call the [facility] Customer Service Team at [phone].  RN placed call to pt daughter informing her of this information.  Daughter verbalized understanding and appreciative of help.

## 2018-12-17 ENCOUNTER — Other Ambulatory Visit: Payer: Self-pay

## 2018-12-17 ENCOUNTER — Encounter: Payer: Self-pay | Admitting: Podiatry

## 2018-12-17 ENCOUNTER — Ambulatory Visit: Payer: Medicare Other | Admitting: Podiatry

## 2018-12-17 DIAGNOSIS — B351 Tinea unguium: Secondary | ICD-10-CM | POA: Diagnosis not present

## 2018-12-17 DIAGNOSIS — E119 Type 2 diabetes mellitus without complications: Secondary | ICD-10-CM

## 2018-12-17 DIAGNOSIS — Q828 Other specified congenital malformations of skin: Secondary | ICD-10-CM

## 2018-12-17 DIAGNOSIS — M79675 Pain in left toe(s): Secondary | ICD-10-CM

## 2018-12-17 DIAGNOSIS — M79674 Pain in right toe(s): Secondary | ICD-10-CM | POA: Diagnosis not present

## 2018-12-17 NOTE — Progress Notes (Signed)
Patient ID: ALEISE TOOLAN, female   DOB: May 18, 1926, 85 y.o.   MRN: [id number] Complaint:  Visit Type: Patient returns to my office for continued preventative foot care services. Complaint: Patient states" my nails have grown long and thick and become painful to walk and wear shoes" Patient has been diagnosed with DM with no foot complications. The patient presents for preventative foot care services. No changes to ROS.  Painful callus both feet.    Podiatric Exam: Vascular: dorsalis pedis and posterior tibial pulses are palpable bilateral. Capillary return is immediate. Temperature gradient is WNL. Skin turgor WNL  Sensorium: Normal Semmes Weinstein monofilament test. Normal tactile sensation bilaterally. Nail Exam: Pt has thick disfigured discolored nails with subungual debris noted bilateral entire nail hallux through fifth toenails Ulcer Exam: There is no evidence of ulcer or pre-ulcerative changes or infection. Orthopedic Exam: Muscle tone and strength are WNL. No limitations in general ROM. No crepitus or effusions noted. Foot type and digits show no abnormalities. Bony prominences are unremarkable. Skin: No Porokeratosis. No infection or ulcers.Porokeratosis sub 1 left foot and sub 1,2, and 5 right foot.  Diagnosis:  Onychomycosis, , Pain in right toe, pain porokeratosis  B/L  Treatment & Plan Procedures and Treatment: Consent by patient was obtained for treatment procedures. The patient understood the discussion of treatment and procedures well. All questions were answered thoroughly reviewed. Debridement of mycotic and hypertrophic toenails, 1 through 5 bilateral and clearing of subungual debris. No ulceration, no infection noted. Debride porokeratosis.   Return Visit-Office Procedure: Patient instructed to return to the office for a follow up visit 4  months for continued evaluation and treatment.   Gardiner Barefoot DPM

## 2019-04-01 NOTE — Progress Notes (Signed)
Patient Care Team: Willey Blade, MD as PCP - General (Internal Medicine)  DIAGNOSIS:    ICD-10-CM   1. Malignant neoplasm of upper-inner quadrant of left breast in female, estrogen receptor positive ([place])  C50.212    Z17.0     SUMMARY OF ONCOLOGIC HISTORY: Oncology History  Malignant neoplasm of upper-inner quadrant of left breast in female, estrogen receptor positive ([place])  08/26/2018 Initial Diagnosis   Palpable mass 4.2cm in the upper inner left breast at the 11 o'clock position with 4 enlarged axillary lymph nodes. Biopsy on 08/26/18 showed invasive mammary carcinoma, grade 1, HER-2 negative (1+), ER 100%, PR 0%, Ki67 15% in the left breast and axilla.   08/26/2018 Cancer Staging   Staging form: Breast, AJCC 8th Edition - Clinical stage from 08/26/2018: Stage IIB (cT2, cN1, cM0, G1, ER+, PR-, HER2-) - Signed by [name], NP on 09/03/2018   09/02/2018 -  Anti-estrogen oral therapy   Palliative anti-estrogen therapy with anastrozole      CHIEF COMPLIANT: Follow-up of left breat cancer on anastrozole  INTERVAL HISTORY: April Gallagher is a 86 y.o. with above-mentioned history of left breast cancer who is currently on palliative treatment with antiestrogen therapy with anastrozole. She presents to the clinic today for follow-up.   She is tolerating anastrozole extremely well without any side effects.  The tumor in the left breast appears to be smaller.  She is here accompanied by her daughter.  ALLERGIES:  is allergic to other.  MEDICATIONS:  Current Outpatient Medications  Medication Sig Dispense Refill  . ACCU-CHEK SMARTVIEW test strip     . amLODipine (NORVASC) 10 MG tablet Take 10 mg by mouth daily.    Marland Kitchen anastrozole (ARIMIDEX) 1 MG tablet Take 1 tablet (1 mg total) by mouth daily. 90 tablet 3  . atorvastatin (LIPITOR) 40 MG tablet Take 40 mg by mouth at bedtime.     . brimonidine (ALPHAGAN) 0.2 % ophthalmic solution INSTILL 1 DROP INTO RIGHT EYE TWICE DAILY     . calcium carbonate (OS-CAL) 600 MG TABS tablet Take 600 mg by mouth every morning.     . Coenzyme Q10 (CO Q 10 PO) Take 1 tablet by mouth daily.    . Flaxseed, Linseed, (FLAXSEED OIL PO) Take 1 tablet by mouth daily.     Marland Kitchen GARLIC PO Take 1 tablet by mouth daily.     Marland Kitchen glucosamine-chondroitin 500-400 MG tablet Take 1 tablet by mouth daily.    Marland Kitchen latanoprost (XALATAN) 0.005 % ophthalmic solution Place 1 drop into the right eye at bedtime.     . meclizine (ANTIVERT) 25 MG tablet meclizine 25 mg tablet    . metFORMIN (GLUCOPHAGE) 500 MG tablet Take 500 mg by mouth 2 (two) times daily with a meal.      No current facility-administered medications for this visit.    PHYSICAL EXAMINATION: ECOG PERFORMANCE STATUS: 1 - Symptomatic but completely ambulatory  There were no vitals filed for this visit. Filed Weights   04/02/19 1142  Weight: 172 lb 11.2 oz (78.3 kg)    BREAST: . (exam performed in the presence of a chaperone)  LABORATORY DATA:  I have reviewed the data as listed CMP Latest Ref Rng & Units 09/08/2018 06/03/2009  Glucose 70 - 99 mg/dL 89 146(H)  BUN 8 - 23 mg/dL 10 8  Creatinine 0.44 - 1.00 mg/dL 0.75 0.63  Sodium 135 - 145 mmol/L 142 138  Potassium 3.5 - 5.1 mmol/L 3.9 4.0  Chloride 98 -  111 mmol/L 106 105  CO2 22 - 32 mmol/L 27 27  Calcium 8.9 - 10.3 mg/dL 10.0 9.2  Total Protein 6.5 - 8.1 g/dL 7.5 7.3  Total Bilirubin 0.3 - 1.2 mg/dL 0.5 0.5  Alkaline Phos 38 - 126 U/L 61 43  AST 15 - 41 U/L 12(L) 17  ALT 0 - 44 U/L 9 13    Lab Results  Component Value Date   WBC 4.2 09/08/2018   HGB 11.1 (L) 09/08/2018   HCT 35.4 (L) 09/08/2018   MCV 93.7 09/08/2018   PLT 179 09/08/2018   NEUTROABS 2.1 09/08/2018    ASSESSMENT & PLAN:  Malignant neoplasm of upper-inner quadrant of left breast in female, estrogen receptor positive ([facility]) 08/26/2018:Palpable mass 4.2cm in the upper inner left breast at the 11 o'clock position with 4 enlarged axillary lymph nodes. Biopsy on  08/26/18 showed invasivelobularcarcinoma, grade 1-2, HER-2 negative (1+), ER 100%, PR 0%, Ki67 15% in the left breast and axilla. T2N1 stage IIa  CT scans showed the breast cancer and the lymphadenopathy in the axilla and the subpectoral areas. In addition to this she had a left kidney mass measuring 4.5 x 3.5 cm.  Could be a primary renal cell cancer.  But given her age and health issues no surgical approaches are planned.  Current treatment: Palliative treatment with anastrozole 1 mg daily Surgical appointment with Dr. Dalbert Batman: He did not recommend surgery.  Anastrozole toxicities: Denies any adverse effects to anastrozole therapy. I discussed with her that there is no role of imaging studies as long as she is doing well and the tumor appears to be clinically smaller. She continues to stay independent and plans to stay busy by keeping her house and garden clean.  Return to clinic in  1 year for follow-up    No orders of the defined types were placed in this encounter.  The patient has a good understanding of the overall plan. she agrees with it. she will call with any problems that may develop before the next visit here.  Total time spent: 20 mins including face to face time and time spent for planning, charting and coordination of care  Nicholas Lose, MD 04/02/2019  I, Cloyde Reams Dorshimer, am acting as scribe for Dr. Nicholas Lose.  I have reviewed the above documentation for accuracy and completeness, and I agree with the above.

## 2019-04-02 ENCOUNTER — Other Ambulatory Visit: Payer: Self-pay

## 2019-04-02 ENCOUNTER — Inpatient Hospital Stay: Payer: Medicare Other | Attending: Hematology and Oncology | Admitting: Hematology and Oncology

## 2019-04-02 DIAGNOSIS — C50212 Malignant neoplasm of upper-inner quadrant of left female breast: Secondary | ICD-10-CM | POA: Insufficient documentation

## 2019-04-02 DIAGNOSIS — Z79811 Long term (current) use of aromatase inhibitors: Secondary | ICD-10-CM | POA: Diagnosis not present

## 2019-04-02 DIAGNOSIS — Z170 Estrogen receptor positive status [ER+]: Secondary | ICD-10-CM | POA: Diagnosis not present

## 2019-04-02 DIAGNOSIS — Z7984 Long term (current) use of oral hypoglycemic drugs: Secondary | ICD-10-CM | POA: Diagnosis not present

## 2019-04-02 DIAGNOSIS — Z79899 Other long term (current) drug therapy: Secondary | ICD-10-CM | POA: Diagnosis not present

## 2019-04-02 NOTE — Assessment & Plan Note (Signed)
08/26/2018:Palpable mass 4.2cm in the upper inner left breast at the 11 o'clock position with 4 enlarged axillary lymph nodes. Biopsy on 08/26/18 showed invasivelobularcarcinoma, grade 1-2, HER-2 negative (1+), ER 100%, PR 0%, Ki67 15% in the left breast and axilla. T2N1 stage IIa  CT scans showed the breast cancer and the lymphadenopathy in the axilla and the subpectoral areas. In addition to this she had a left kidney mass measuring 4.5 x 3.5 cm.  Could be a primary renal cell cancer.  But given her age and health issues no surgical approaches are planned.  Current treatment: Palliative treatment with anastrozole 1 mg daily Surgical appointment with Dr. Dalbert Batman: He did not recommend surgery.  Anastrozole toxicities: Denies any adverse effects to anastrozole therapy. She is very independent and wants to be as active as possible.  She loves gardening and wants to do heavy lifting as well.  I instructed her to be careful and avoid unnecessary injuries. Return to clinic in  1 year for follow-up

## 2019-04-22 ENCOUNTER — Ambulatory Visit: Payer: Medicare Other | Admitting: Podiatry

## 2019-08-10 ENCOUNTER — Other Ambulatory Visit: Payer: Self-pay | Admitting: *Deleted

## 2019-08-10 MED ORDER — ANASTROZOLE 1 MG PO TABS: 1.0000 mg | ORAL_TABLET | Freq: Every day | ORAL | 3 refills | Status: DC

## 2019-10-02 ENCOUNTER — Other Ambulatory Visit: Payer: Self-pay

## 2019-10-02 ENCOUNTER — Other Ambulatory Visit: Payer: Self-pay | Admitting: Hematology and Oncology

## 2019-10-02 ENCOUNTER — Ambulatory Visit
Admission: RE | Admit: 2019-10-02 | Discharge: 2019-10-02 | Disposition: A | Discharge status: Home / Self Care | Payer: Medicare Other | Source: Ambulatory Visit | Attending: Hematology and Oncology | Admitting: Hematology and Oncology

## 2019-10-02 DIAGNOSIS — Z170 Estrogen receptor positive status [ER+]: Secondary | ICD-10-CM

## 2019-10-02 DIAGNOSIS — C50212 Malignant neoplasm of upper-inner quadrant of left female breast: Secondary | ICD-10-CM

## 2020-01-22 ENCOUNTER — Encounter: Payer: Self-pay | Admitting: Podiatry

## 2020-01-22 ENCOUNTER — Ambulatory Visit: Payer: Medicare Other | Admitting: Podiatry

## 2020-01-22 ENCOUNTER — Other Ambulatory Visit: Payer: Self-pay

## 2020-01-22 DIAGNOSIS — M79675 Pain in left toe(s): Secondary | ICD-10-CM | POA: Diagnosis not present

## 2020-01-22 DIAGNOSIS — B351 Tinea unguium: Secondary | ICD-10-CM

## 2020-01-22 DIAGNOSIS — E119 Type 2 diabetes mellitus without complications: Secondary | ICD-10-CM | POA: Diagnosis not present

## 2020-01-22 DIAGNOSIS — Q828 Other specified congenital malformations of skin: Secondary | ICD-10-CM | POA: Diagnosis not present

## 2020-01-22 DIAGNOSIS — M79674 Pain in right toe(s): Secondary | ICD-10-CM

## 2020-01-22 NOTE — Progress Notes (Signed)
This patient returns to my office for at risk foot care.  This patient requires this care by a professional since this patient will be at risk due to having type 2 diabetes.  Patient has not been seen in over 1 year.  This patient is unable to cut nails herself since the patient cannot reach her nails.These nails are painful walking and wearing shoes. Patient  Has painful callus both feet.  This patient presents for at risk foot care today.  General Appearance  Alert, conversant and in no acute stress.  Vascular  Dorsalis pedis and posterior tibial  pulses are weakly  palpable  bilaterally.  Capillary return is within normal limits  bilaterally. Temperature is within normal limits  bilaterally.  Neurologic  Senn-Weinstein monofilament wire test within normal limits  bilaterally. Muscle power within normal limits bilaterally.  Nails Thick disfigured discolored nails with subungual debris  from hallux to fifth toes bilaterally. No evidence of bacterial infection or drainage bilaterally.  Orthopedic  No limitations of motion  feet .  No crepitus or effusions noted.  No bony pathology or digital deformities noted.  Skin  normotropic skin with no porokeratosis noted bilaterally.  No signs of infections or ulcers noted.   Porokeratosis sib 1 left and sub 2 right.  Onychomycosis  Pain in right toes  Pain in left toes  Porokeratosis  B/L.  Consent was obtained for treatment procedures.   Mechanical debridement of nails 1-5  bilaterally performed with a nail nipper.  Filed with dremel without incident. Debride porokeratosis with # 15 blade.   Return office visit  prn                   Told patient to return for periodic foot care and evaluation due to potential at risk complications.   Gardiner Barefoot DPM

## 2020-05-20 ENCOUNTER — Ambulatory Visit: Payer: Medicare Other | Admitting: Podiatry

## 2020-05-30 ENCOUNTER — Other Ambulatory Visit: Payer: Self-pay

## 2020-05-30 ENCOUNTER — Ambulatory Visit: Payer: Medicare Other | Admitting: Podiatry

## 2020-05-30 ENCOUNTER — Encounter: Payer: Self-pay | Admitting: Podiatry

## 2020-05-30 DIAGNOSIS — M79675 Pain in left toe(s): Secondary | ICD-10-CM

## 2020-05-30 DIAGNOSIS — B351 Tinea unguium: Secondary | ICD-10-CM

## 2020-05-30 DIAGNOSIS — E119 Type 2 diabetes mellitus without complications: Secondary | ICD-10-CM

## 2020-05-30 DIAGNOSIS — Q828 Other specified congenital malformations of skin: Secondary | ICD-10-CM

## 2020-05-30 DIAGNOSIS — M79674 Pain in right toe(s): Secondary | ICD-10-CM

## 2020-05-30 NOTE — Progress Notes (Signed)
This patient returns to my office for at risk foot care.  This patient requires this care by a professional since this patient will be at risk due to having type 2 diabetes.  Patient has not been seen in over 1 year.  This patient is unable to cut nails herself since the patient cannot reach her nails.These nails are painful walking and wearing shoes. Patient  Has painful callus both feet.  This patient presents for at risk foot care today.  General Appearance  Alert, conversant and in no acute stress.  Vascular  Dorsalis pedis and posterior tibial  pulses are weakly  palpable  bilaterally.  Capillary return is within normal limits  bilaterally. Temperature is within normal limits  bilaterally.  Neurologic  Senn-Weinstein monofilament wire test within normal limits  bilaterally. Muscle power within normal limits bilaterally.  Nails Thick disfigured discolored nails with subungual debris  from hallux to fifth toes bilaterally. No evidence of bacterial infection or drainage bilaterally.  Orthopedic  No limitations of motion  feet .  No crepitus or effusions noted.  No bony pathology or digital deformities noted.  Skin  normotropic skin with no porokeratosis noted bilaterally.  No signs of infections or ulcers noted.   Porokeratosis sib 1 left and sub 2 right.  Onychomycosis  Pain in right toes  Pain in left toes  Porokeratosis  B/L.  Consent was obtained for treatment procedures.   Mechanical debridement of nails 1-5  bilaterally performed with a nail nipper.  Filed with dremel without incident. Debride porokeratosis with # 15 blade.   Return office visit  prn                   Told patient to return for periodic foot care and evaluation due to potential at risk complications.   Gardiner Barefoot DPM

## 2020-06-06 ENCOUNTER — Telehealth: Payer: Self-pay | Admitting: Hematology and Oncology

## 2020-06-06 NOTE — Telephone Encounter (Signed)
Scheduled appt per 5/23 sch msg. Pt's daughter is aware.

## 2020-06-22 NOTE — Progress Notes (Signed)
Patient Care Team: Willey Blade, MD as PCP - General (Internal Medicine)  DIAGNOSIS:    ICD-10-CM   1. Malignant neoplasm of upper-inner quadrant of left breast in female, estrogen receptor positive ([place])  C50.212    Z17.0       SUMMARY OF ONCOLOGIC HISTORY: Oncology History  Malignant neoplasm of upper-inner quadrant of left breast in female, estrogen receptor positive ([place])  08/26/2018 Initial Diagnosis   Palpable mass 4.2cm in the upper inner left breast at the 11 o'clock position with 4 enlarged axillary lymph nodes. Biopsy on 08/26/18 showed invasive mammary carcinoma, grade 1, HER-2 negative (1+), ER 100%, PR 0%, Ki67 15% in the left breast and axilla.   08/26/2018 Cancer Staging   Staging form: Breast, AJCC 8th Edition - Clinical stage from 08/26/2018: Stage IIB (cT2, cN1, cM0, G1, ER+, PR-, HER2-) - Signed by [name], NP on 09/03/2018    09/02/2018 -  Anti-estrogen oral therapy   Palliative anti-estrogen therapy with anastrozole      CHIEF COMPLIANT: Follow-up of left breat cancer on anastrozole  INTERVAL HISTORY: April Gallagher is a 87 y.o. with above-mentioned history of left breast cancer who is currently on palliative treatment with antiestrogen therapy with anastrozole. Mammogram on 10/02/19 showed interval decrease in the known left breast malignancy, previously 4.2x3.4x2.1cm, and currently 4.1x2.9x1.5cm. She presents to the clinic today for follow-up.  She is tolerating anastrozole extremely well without any problems or concerns. Patient thought that the left breast lump has increased in size and therefore she wanted to come and see [place].  ALLERGIES:  is allergic to other.  MEDICATIONS:  Current Outpatient Medications  Medication Sig Dispense Refill   ACCU-CHEK SMARTVIEW test strip      amLODipine (NORVASC) 10 MG tablet Take 10 mg by mouth daily.     anastrozole (ARIMIDEX) 1 MG tablet Take 1 tablet (1 mg total) by mouth daily. 90 tablet 3    atorvastatin (LIPITOR) 40 MG tablet Take 40 mg by mouth at bedtime.      brimonidine (ALPHAGAN) 0.2 % ophthalmic solution INSTILL 1 DROP INTO RIGHT EYE TWICE DAILY     calcium carbonate (OS-CAL) 600 MG TABS tablet Take 600 mg by mouth every morning.      Coenzyme Q10 (CO Q 10 PO) Take 1 tablet by mouth daily.     Flaxseed, Linseed, (FLAXSEED OIL PO) Take 1 tablet by mouth daily.      GARLIC PO Take 1 tablet by mouth daily.      glucosamine-chondroitin 500-400 MG tablet Take 1 tablet by mouth daily.     ketorolac (TORADOL) 60 MG/2ML SOLN injection ketorolac 60 mg/2 mL intramuscular solution  Inject 60 mg IM x 1     latanoprost (XALATAN) 0.005 % ophthalmic solution Place 1 drop into the right eye at bedtime.      meclizine (ANTIVERT) 25 MG tablet meclizine 25 mg tablet     metFORMIN (GLUCOPHAGE) 500 MG tablet Take 500 mg by mouth 2 (two) times daily with a meal.      predniSONE (DELTASONE) 5 MG tablet prednisone 5 mg tablets in a dose pack  TAKE AS DIRECTED FOR 6 DAYS     No current facility-administered medications for this visit.    PHYSICAL EXAMINATION: ECOG PERFORMANCE STATUS: 1 - Symptomatic but completely ambulatory  Vitals:   06/23/20 1504  BP: (!) 155/61  Pulse: 90  Resp: 18  Temp: 98.1 F (36.7 C)  SpO2: 99%   Filed Weights  06/23/20 1504  Weight: 162 lb 14.4 oz (73.9 kg)      LABORATORY DATA:  I have reviewed the data as listed CMP Latest Ref Rng & Units 09/08/2018 06/03/2009  Glucose 70 - 99 mg/dL 89 146(H)  BUN 8 - 23 mg/dL 10 8  Creatinine 0.44 - 1.00 mg/dL 0.75 0.63  Sodium 135 - 145 mmol/L 142 138  Potassium 3.5 - 5.1 mmol/L 3.9 4.0  Chloride 98 - 111 mmol/L 106 105  CO2 22 - 32 mmol/L 27 27  Calcium 8.9 - 10.3 mg/dL 10.0 9.2  Total Protein 6.5 - 8.1 g/dL 7.5 7.3  Total Bilirubin 0.3 - 1.2 mg/dL 0.5 0.5  Alkaline Phos 38 - 126 U/L 61 43  AST 15 - 41 U/L 12(L) 17  ALT 0 - 44 U/L 9 13    Lab Results  Component Value Date   WBC 4.2 09/08/2018   HGB  11.1 (L) 09/08/2018   HCT 35.4 (L) 09/08/2018   MCV 93.7 09/08/2018   PLT 179 09/08/2018   NEUTROABS 2.1 09/08/2018    ASSESSMENT & PLAN:  Malignant neoplasm of upper-inner quadrant of left breast in female, estrogen receptor positive ([name]) 08/26/2018:Palpable mass 4.2cm in the upper inner left breast at the 11 o'clock position with 4 enlarged axillary lymph nodes. Biopsy on 08/26/18 showed invasive lobular carcinoma, grade 1-2, HER-2 negative (1+), ER 100%, PR 0%, Ki67 15% in the left breast and axilla. T2N1 stage IIa   CT scans showed the breast cancer and the lymphadenopathy in the axilla and the subpectoral areas.  In addition to this she had a left kidney mass measuring 4.5 x 3.5 cm.  Could be a primary renal cell cancer.  But given her age and health issues no surgical approaches are planned.   Current treatment: Palliative treatment with anastrozole 1 mg daily Surgical appointment with Dr. Dalbert Batman: He did not recommend surgery.   Anastrozole toxicities: Denies any adverse effects to anastrozole therapy. 10/02/2019: Mammogram and ultrasound: Interval reduction of the size of the left breast mass and axillary lymph nodes.  Currently measures 4.1 x 2.9 x 1.5 (previously 4.2 x 3.4 x 2.1 cm) multiple prominent axillary lymph nodes measuring up to 3 mm.  Improved compared to before.   Breast exam: I do not feel that the tumor in the breast has increased in size.  It is freely mobile and it is not involving the skin. Therefore I recommended that she can watch it and get a mammogram in September at her annual scheduled time.  Return to clinic in  1 year for follow-up    No orders of the defined types were placed in this encounter.  The patient has a good understanding of the overall plan. she agrees with it. she will call with any problems that may develop before the next visit here.  Total time spent: 20 mins including face to face time and time spent for planning, charting and  coordination of care  Rulon Eisenmenger, MD, MPH 06/23/2020  I, Cloyde Reams Dorshimer, am acting as scribe for Dr. Nicholas Lose.  I have reviewed the above documentation for accuracy and completeness, and I agree with the above.

## 2020-06-22 NOTE — Assessment & Plan Note (Signed)
08/26/2018:Palpable mass 4.2cm in the upper inner left breast at the 11 o'clock position with 4 enlarged axillary lymph nodes. Biopsy on 08/26/18 showed invasivelobularcarcinoma, grade 1-2, HER-2 negative (1+), ER 100%, PR 0%, Ki67 15% in the left breast and axilla. T2N1 stage IIa  CT scans showed the breast cancer and the lymphadenopathy in the axilla and the subpectoral areas. In addition to this she had a left kidney mass measuring 4.5 x 3.5 cm.Could be a primary renal cell cancer. But given her age and health issues no surgical approaches are planned.  Current treatment: Palliative treatment with anastrozole 1 mg daily Surgical appointment with Dr. Dalbert Batman: He did not recommend surgery.  Anastrozole toxicities:Denies any adverse effects to anastrozole therapy. I discussed with her that there is no role of imaging studies as long as she is doing well and the tumor appears to be clinically smaller. She continues to stay independent and plans to stay busy by keeping her house and garden clean.  Return to clinic in 1 year for follow-up

## 2020-06-23 ENCOUNTER — Inpatient Hospital Stay: Payer: Medicare Other | Attending: Hematology and Oncology | Admitting: Hematology and Oncology

## 2020-06-23 ENCOUNTER — Other Ambulatory Visit: Payer: Self-pay

## 2020-06-23 DIAGNOSIS — N2889 Other specified disorders of kidney and ureter: Secondary | ICD-10-CM | POA: Diagnosis not present

## 2020-06-23 DIAGNOSIS — R590 Localized enlarged lymph nodes: Secondary | ICD-10-CM | POA: Insufficient documentation

## 2020-06-23 DIAGNOSIS — Z79811 Long term (current) use of aromatase inhibitors: Secondary | ICD-10-CM | POA: Insufficient documentation

## 2020-06-23 DIAGNOSIS — C50212 Malignant neoplasm of upper-inner quadrant of left female breast: Secondary | ICD-10-CM | POA: Diagnosis present

## 2020-06-23 DIAGNOSIS — Z170 Estrogen receptor positive status [ER+]: Secondary | ICD-10-CM | POA: Diagnosis not present

## 2020-06-23 DIAGNOSIS — Z79899 Other long term (current) drug therapy: Secondary | ICD-10-CM | POA: Insufficient documentation

## 2020-06-27 ENCOUNTER — Telehealth: Payer: Self-pay | Admitting: Hematology and Oncology

## 2020-06-27 NOTE — Telephone Encounter (Signed)
Per 6/9 los. Called and spoke with pt daughter, confirmed 6/9 appt

## 2020-08-05 ENCOUNTER — Other Ambulatory Visit: Payer: Self-pay | Admitting: Hematology and Oncology

## 2020-09-14 ENCOUNTER — Other Ambulatory Visit: Payer: Self-pay | Admitting: Hematology and Oncology

## 2020-09-14 DIAGNOSIS — Z1231 Encounter for screening mammogram for malignant neoplasm of breast: Secondary | ICD-10-CM

## 2020-09-21 ENCOUNTER — Encounter: Payer: Self-pay | Admitting: Podiatry

## 2020-09-21 ENCOUNTER — Other Ambulatory Visit: Payer: Self-pay

## 2020-09-21 ENCOUNTER — Ambulatory Visit (INDEPENDENT_AMBULATORY_CARE_PROVIDER_SITE_OTHER): Payer: Medicare Other | Admitting: Podiatry

## 2020-09-21 DIAGNOSIS — M79675 Pain in left toe(s): Secondary | ICD-10-CM

## 2020-09-21 DIAGNOSIS — E119 Type 2 diabetes mellitus without complications: Secondary | ICD-10-CM

## 2020-09-21 DIAGNOSIS — M79674 Pain in right toe(s): Secondary | ICD-10-CM

## 2020-09-21 DIAGNOSIS — B351 Tinea unguium: Secondary | ICD-10-CM | POA: Diagnosis not present

## 2020-09-21 DIAGNOSIS — Q828 Other specified congenital malformations of skin: Secondary | ICD-10-CM

## 2020-09-21 NOTE — Progress Notes (Signed)
This patient returns to my office for at risk foot care.  This patient requires this care by a professional since this patient will be at risk due to having type 2 diabetes.  Patient has not been seen in over 1 year.  This patient is unable to cut nails herself since the patient cannot reach her nails.These nails are painful walking and wearing shoes. Patient  Has painful callus both feet.  This patient presents for at risk foot care today.  General Appearance  Alert, conversant and in no acute stress.  Vascular  Dorsalis pedis and posterior tibial  pulses are weakly  palpable  bilaterally.  Capillary return is within normal limits  bilaterally. Temperature is within normal limits  bilaterally.  Neurologic  Senn-Weinstein monofilament wire test within normal limits  bilaterally. Muscle power within normal limits bilaterally.  Nails Thick disfigured discolored nails with subungual debris  from hallux to fifth toes bilaterally. No evidence of bacterial infection or drainage bilaterally.  Orthopedic  No limitations of motion  feet .  No crepitus or effusions noted.  No bony pathology or digital deformities noted.  Skin  normotropic skin with no porokeratosis noted bilaterally.  No signs of infections or ulcers noted.   Porokeratosis sib 1 left and sub 2 right.  Onychomycosis  Pain in right toes  Pain in left toes  Porokeratosis  B/L.  Consent was obtained for treatment procedures.   Mechanical debridement of nails 1-5  bilaterally performed with a nail nipper.  Filed with dremel without incident. Debride porokeratosis with # 15 blade.   Return office visit  3 months                  Told patient to return for periodic foot care and evaluation due to potential at risk complications.   Gardiner Barefoot DPM

## 2020-10-11 IMAGING — US ULTRASOUND LEFT BREAST LIMITED
1 series · 13 of 23 positions shown · non-contrast
Comparison: Previous exam(s).

CLINICAL DATA: [AGE] female with palpable LEFT breast lump
identified on self-examination.

EXAM:
DIGITAL DIAGNOSTIC BILATERAL MAMMOGRAM WITH CAD AND TOMO
ULTRASOUND LEFT BREAST

[Series 1: ultrasound left breast limited · 0.07mm/px · 13 of 23 slices shown]
[im 1/23]
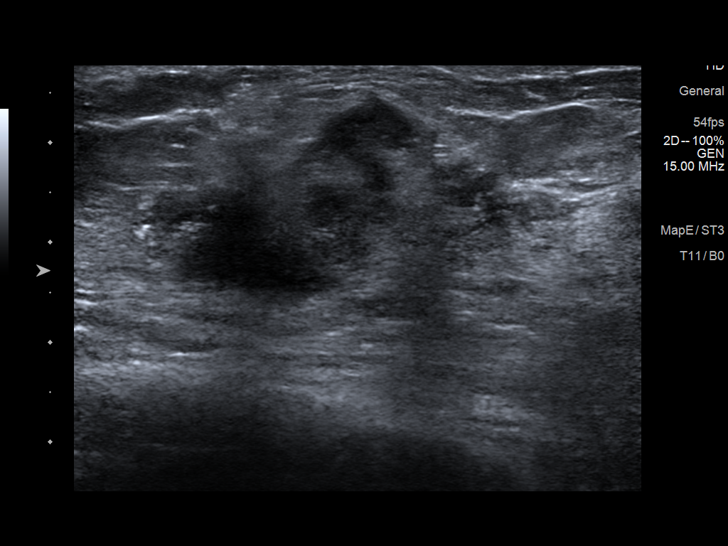
[im 3/23]
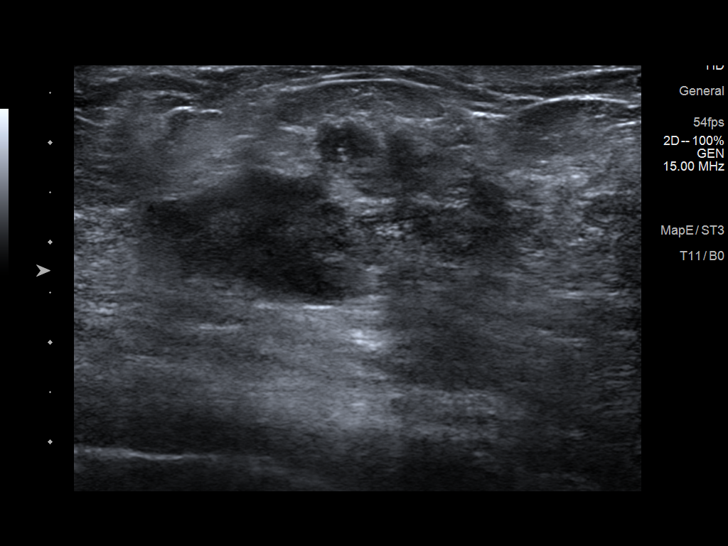
[im 5/23]
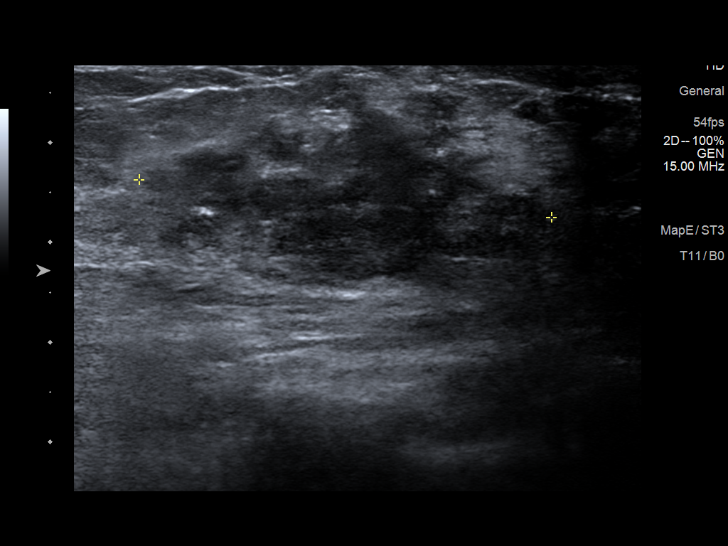
[im 7/23]
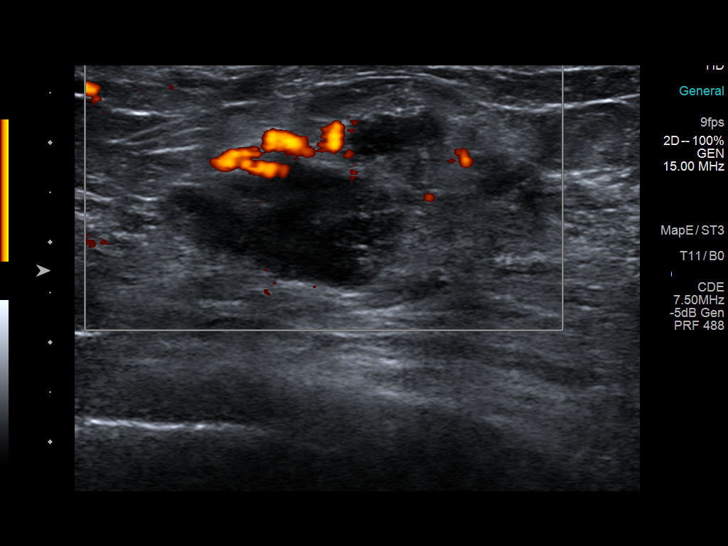
[im 8/23]
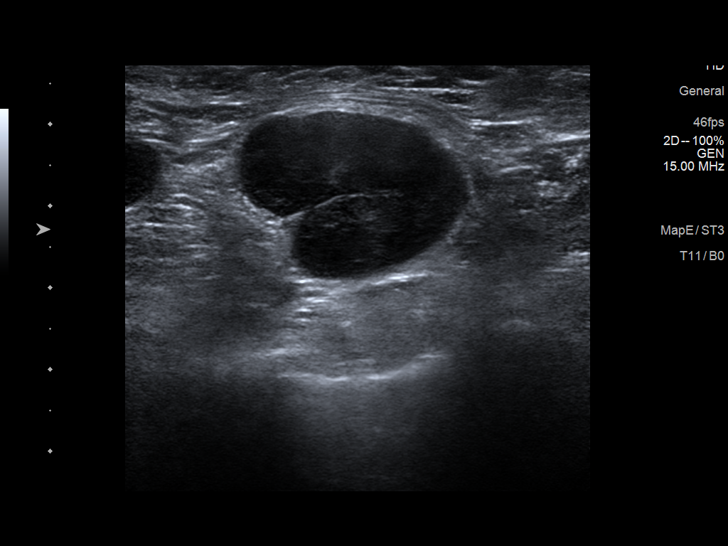
[im 10/23]
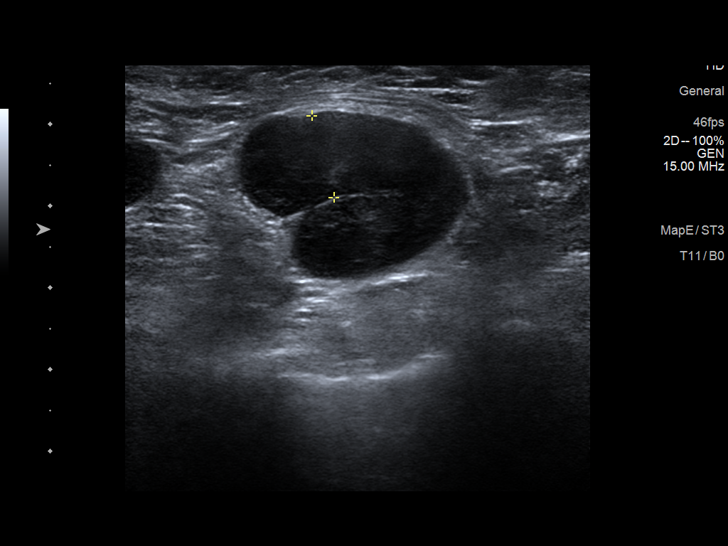
[im 12/23]
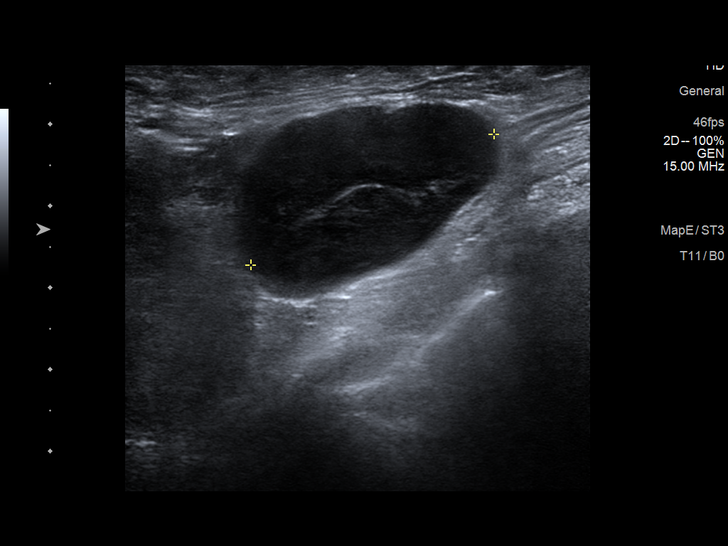
[im 14/23]
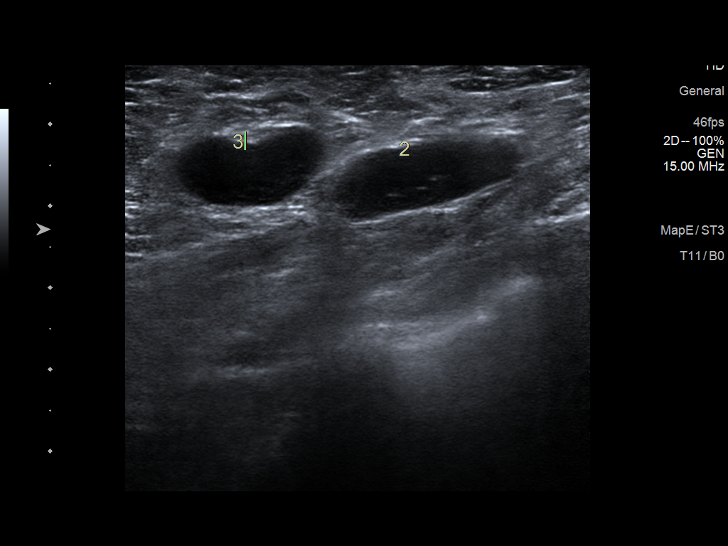
[im 16/23]
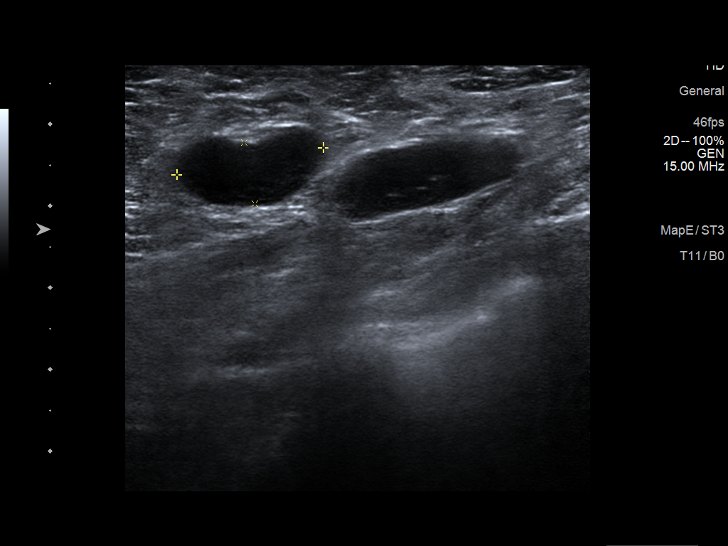
[im 17/23]
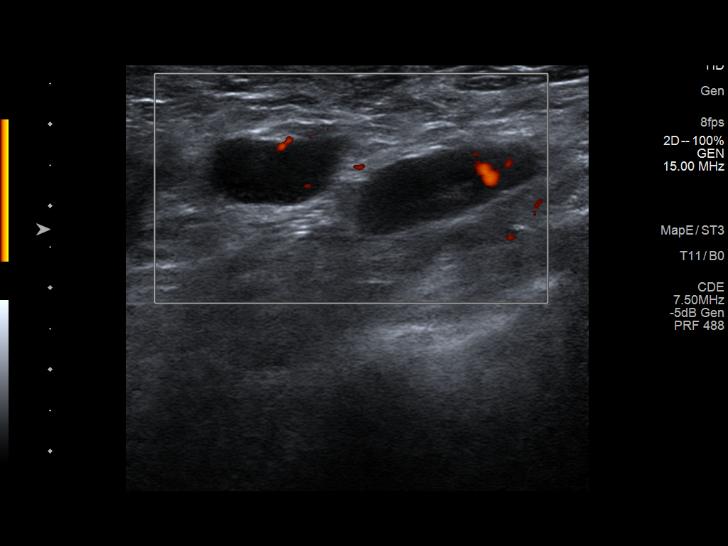
[im 19/23]
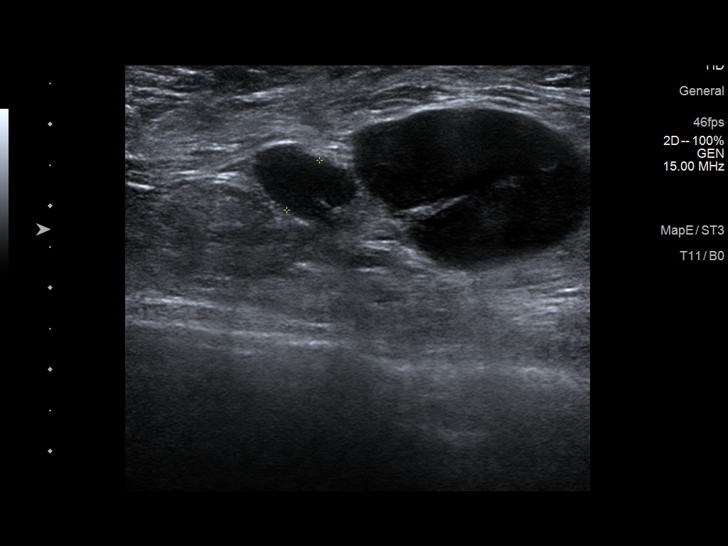
[im 21/23]
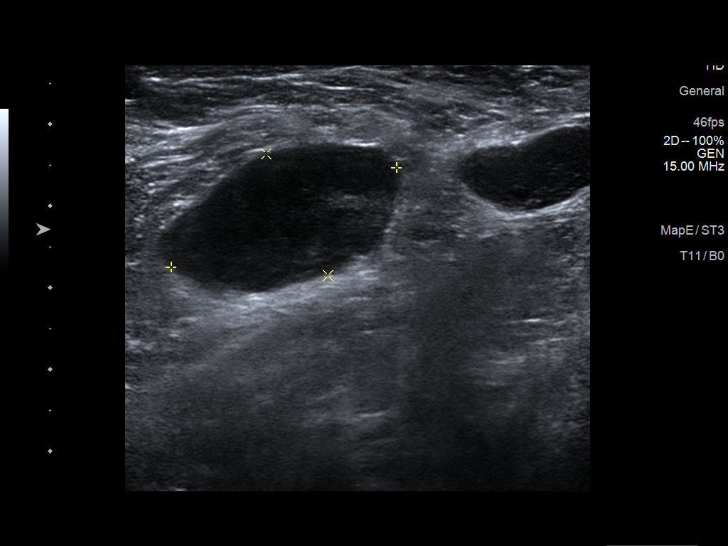
[im 23/23]
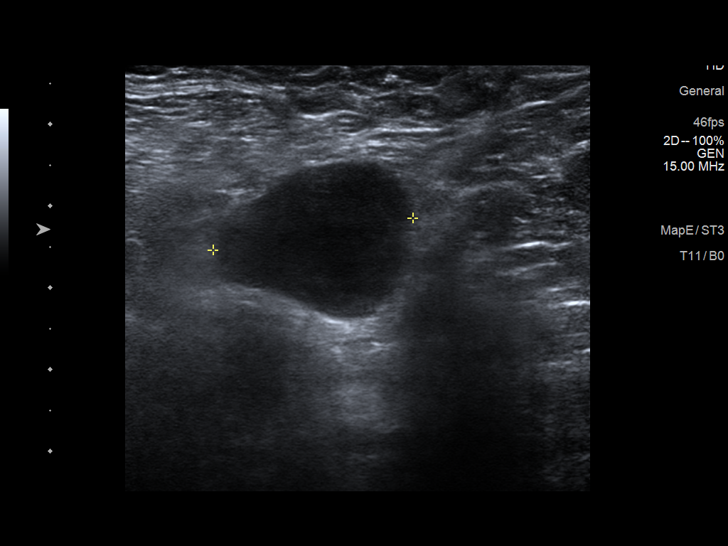

[13 of 23 positions shown; findings below may reference images not displayed]

ACR Breast Density Category c: The breast tissue is heterogeneously
dense, which may obscure small masses.
FINDINGS: 2D/3D full field views of both breasts and spot compression view of
the LEFT breast demonstrate an obscured mass within the UPPER INNER
LEFT breast.

No other suspicious mammographic findings are noted within either
breast.

Mammographic images were processed with CAD.

On physical exam, a firm palpable mass identified at the 11 o'clock
position of the LEFT breast 2 cm from the nipple.

Targeted ultrasound is performed, showing a 3.4 x 2.1 x 4.2 cm
irregular hypoechoic mass at the 11 o'clock position of the LEFT
breast 2 cm from the nipple.

4 enlarged LEFT axillary lymph nodes are identified with cortical
thickening measuring up to 1 cm.
IMPRESSION: 1. Suspicious 4.2 cm UPPER INNER LEFT breast mass with 4 enlarged
LEFT axillary lymph nodes. Tissue sampling of the LEFT breast mass
and 1 of the enlarged LEFT axillary lymph nodes recommended.
2. No mammographic evidence of RIGHT breast malignancy.

RECOMMENDATION:
Ultrasound-guided LEFT breast and LEFT axillary biopsies, which will
be scheduled.

I have discussed the findings and recommendations with the patient.
If applicable, a reminder letter will be sent to the patient
regarding the next appointment.

BI-RADS CATEGORY  4: Suspicious.

## 2020-10-15 IMAGING — MG MM CLIP PLACEMENT
6 series · 6 of 18 positions shown · non-contrast
Comparison: Previous exam(s).

CLINICAL DATA: Evaluate RIBBON clip position following
ultrasound-guided LEFT breast biopsy.

EXAM:
DIAGNOSTIC LEFT MAMMOGRAM POST ULTRASOUND BIOPSY

[L ML synth-2D]
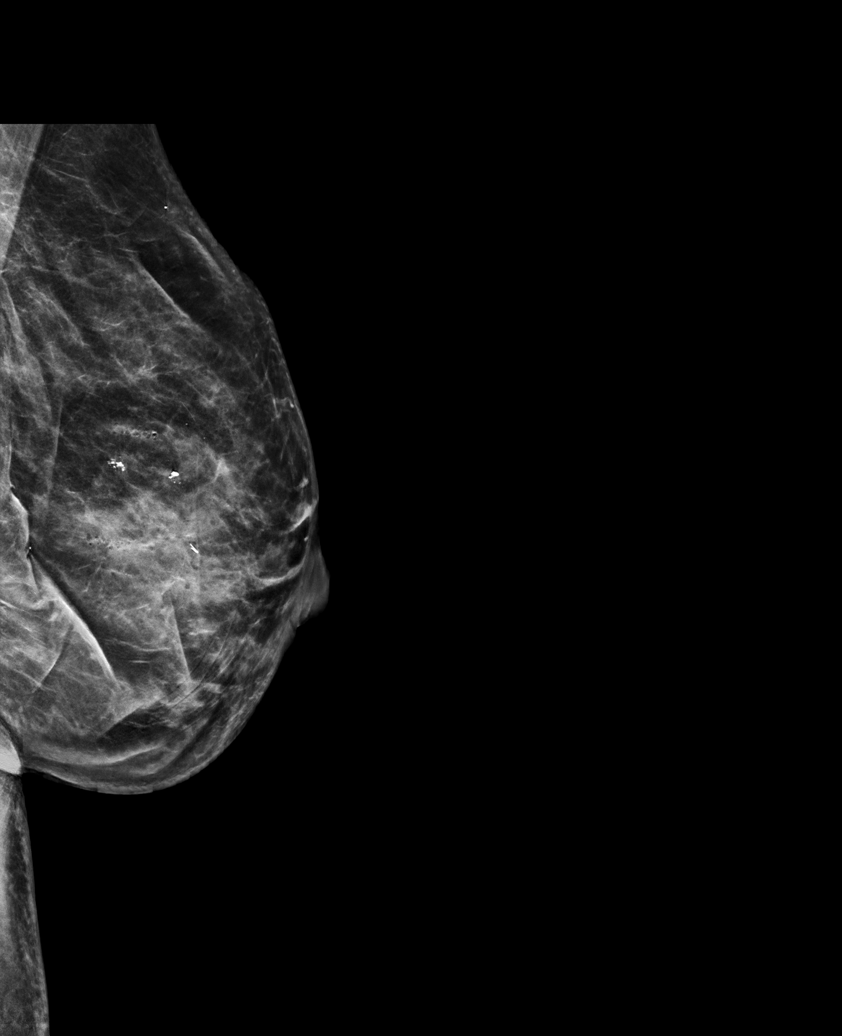

[L MLO synth-2D]
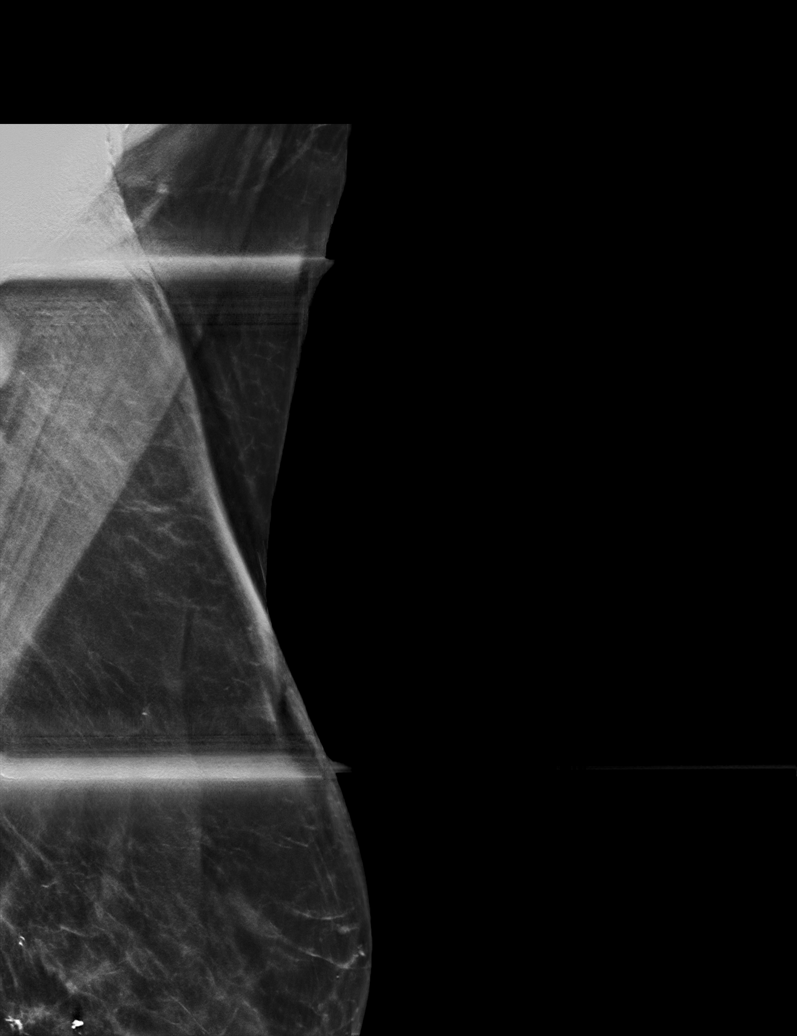

[L CC synth-2D]
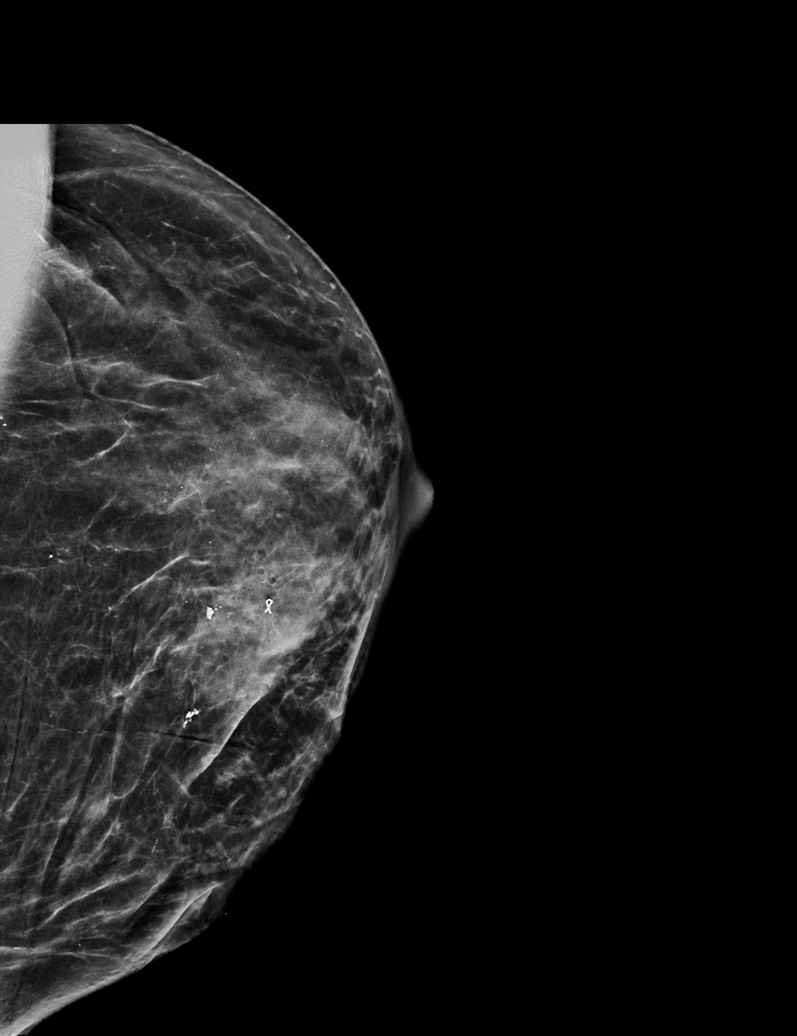

[L ML tomo · tomo slice 34/67.0]
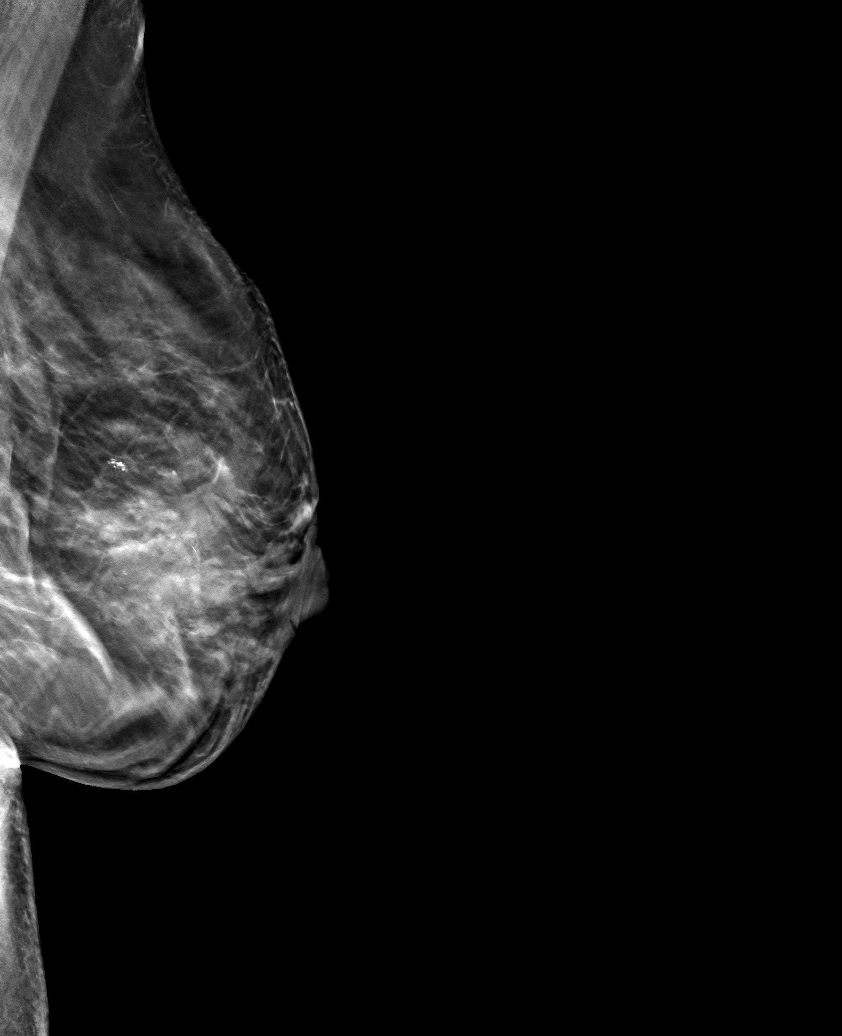

[L MLO tomo · tomo slice 37/74.0]
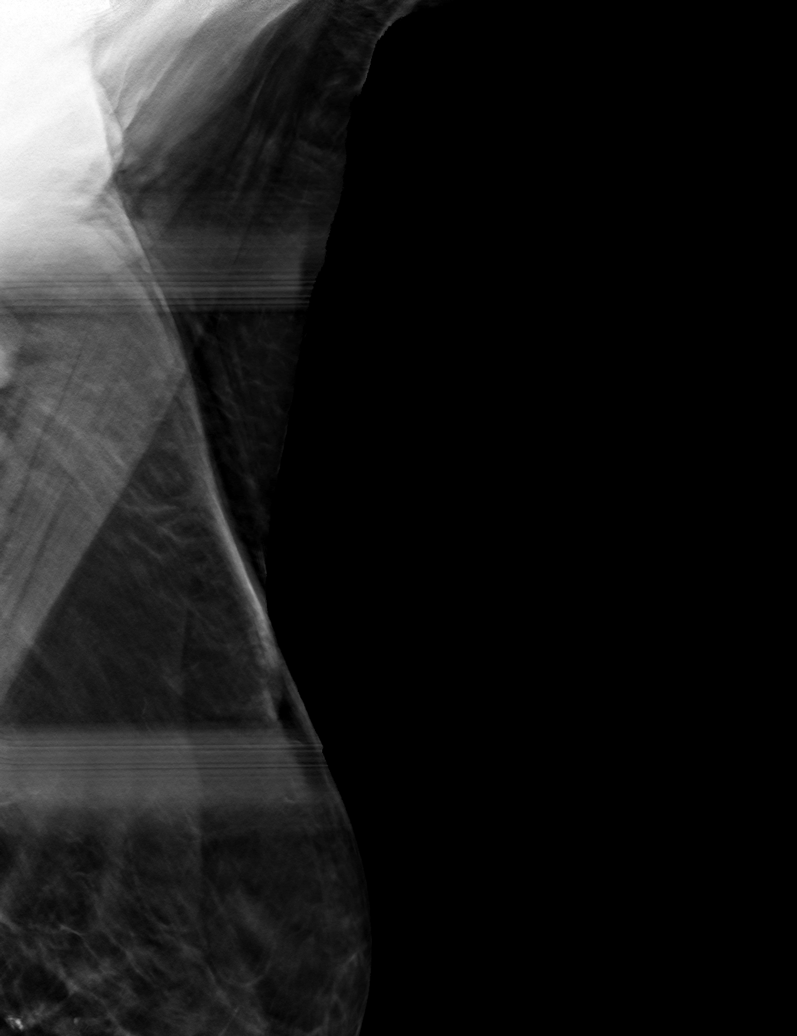

[L CC tomo · tomo slice 28/55.0]
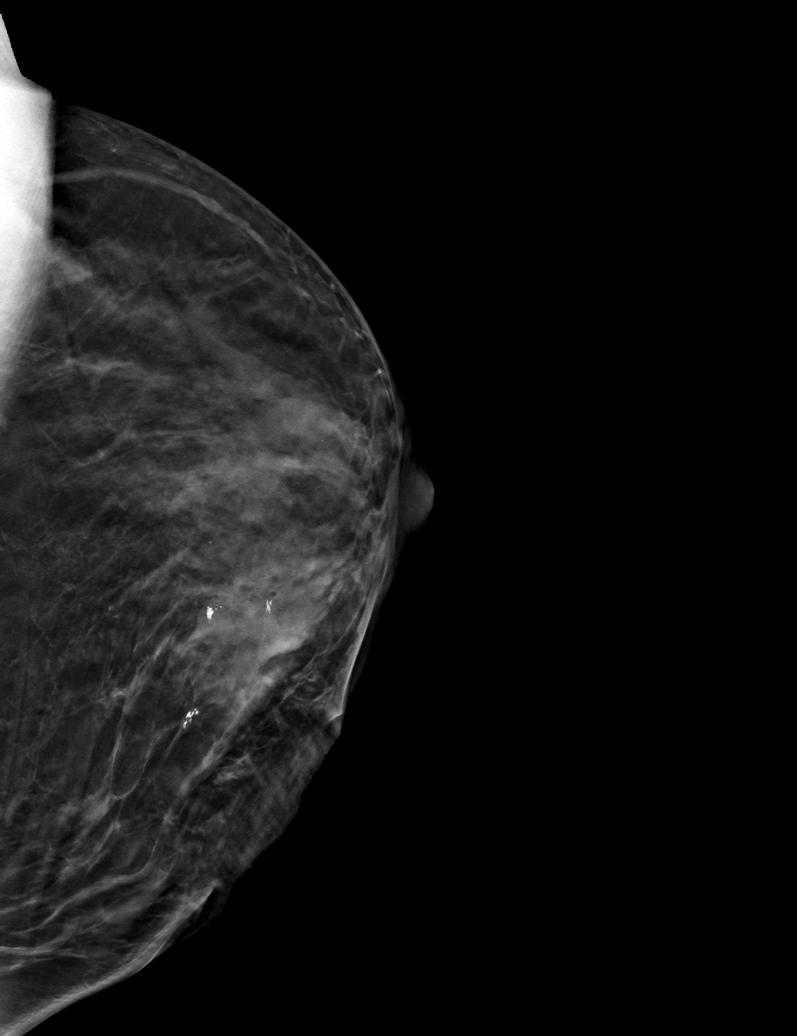

[6 of 18 positions shown; findings below may reference images not displayed]

FINDINGS: Mammographic images were obtained following ultrasound guided biopsy
of the 4.2 cm mass at the 11 o'clock position of the LEFT breast.

The RIBBON clip is in satisfactory position.

The Q shaped clip placed in the biopsied LEFT axillary lymph node is
not visualized on this study, but satisfactory placement was noted
sonographically.
IMPRESSION: Satisfactory RIBBON clip position following ultrasound-guided LEFT
breast biopsy.

The Q shaped clip within the biopsied LEFT axillary lymph node not
visualized on this study but satisfactory placement was noted
sonographically.

Final Assessment: Post Procedure Mammograms for Marker Placement

## 2020-11-01 ENCOUNTER — Other Ambulatory Visit: Payer: Self-pay

## 2020-11-01 ENCOUNTER — Ambulatory Visit
Admission: RE | Admit: 2020-11-01 | Discharge: 2020-11-01 | Disposition: A | Discharge status: Home / Self Care | Payer: Medicare Other | Source: Ambulatory Visit | Attending: Hematology and Oncology | Admitting: Hematology and Oncology

## 2020-11-01 DIAGNOSIS — Z1231 Encounter for screening mammogram for malignant neoplasm of breast: Secondary | ICD-10-CM

## 2020-11-02 ENCOUNTER — Other Ambulatory Visit: Payer: Self-pay | Admitting: Hematology and Oncology

## 2020-11-02 DIAGNOSIS — Z853 Personal history of malignant neoplasm of breast: Secondary | ICD-10-CM

## 2020-11-12 ENCOUNTER — Other Ambulatory Visit: Payer: Self-pay | Admitting: Hematology and Oncology

## 2020-11-14 ENCOUNTER — Other Ambulatory Visit: Payer: Self-pay

## 2020-11-14 MED ORDER — ANASTROZOLE 1 MG PO TABS
1.0000 mg | ORAL_TABLET | Freq: Every day | ORAL | 2 refills | Status: DC
Start: 2020-11-14 — End: 2021-08-10

## 2020-12-22 ENCOUNTER — Other Ambulatory Visit: Payer: Self-pay

## 2020-12-22 ENCOUNTER — Other Ambulatory Visit: Payer: Self-pay | Admitting: Hematology and Oncology

## 2020-12-22 ENCOUNTER — Ambulatory Visit
Admission: RE | Admit: 2020-12-22 | Discharge: 2020-12-22 | Disposition: A | Discharge status: Home / Self Care | Payer: Medicare Other | Source: Ambulatory Visit | Attending: Hematology and Oncology | Admitting: Hematology and Oncology

## 2020-12-22 DIAGNOSIS — Z853 Personal history of malignant neoplasm of breast: Secondary | ICD-10-CM

## 2021-01-24 ENCOUNTER — Ambulatory Visit (INDEPENDENT_AMBULATORY_CARE_PROVIDER_SITE_OTHER): Payer: Medicare Other | Admitting: Podiatry

## 2021-01-24 ENCOUNTER — Other Ambulatory Visit: Payer: Self-pay

## 2021-01-24 ENCOUNTER — Encounter: Payer: Self-pay | Admitting: Podiatry

## 2021-01-24 DIAGNOSIS — E1142 Type 2 diabetes mellitus with diabetic polyneuropathy: Secondary | ICD-10-CM

## 2021-01-24 DIAGNOSIS — E1140 Type 2 diabetes mellitus with diabetic neuropathy, unspecified: Secondary | ICD-10-CM | POA: Insufficient documentation

## 2021-01-24 DIAGNOSIS — E1159 Type 2 diabetes mellitus with other circulatory complications: Secondary | ICD-10-CM | POA: Insufficient documentation

## 2021-01-24 DIAGNOSIS — B351 Tinea unguium: Secondary | ICD-10-CM

## 2021-01-24 DIAGNOSIS — Q828 Other specified congenital malformations of skin: Secondary | ICD-10-CM

## 2021-01-24 DIAGNOSIS — M79675 Pain in left toe(s): Secondary | ICD-10-CM

## 2021-01-24 DIAGNOSIS — M79674 Pain in right toe(s): Secondary | ICD-10-CM

## 2021-01-24 DIAGNOSIS — E119 Type 2 diabetes mellitus without complications: Secondary | ICD-10-CM

## 2021-01-24 NOTE — Progress Notes (Signed)
This patient returns to my office for at risk foot care.  This patient requires this care by a professional since this patient will be at risk due to having type 2 diabetes with angiopathy.     This patient is unable to cut nails herself since the patient cannot reach her nails.These nails are painful walking and wearing shoes. Patient  has painful callus both feet. She presents to the office with her daughter.  She requests diabetic shoes. This patient presents for at risk foot care today.  General Appearance  Alert, conversant and in no acute stress.  Vascular  Dorsalis pedis and posterior tibial  pulses are weakly  palpable  bilaterally.  Capillary return is within normal limits  bilaterally. Temperature is within normal limits  bilaterally.  Neurologic  Senn-Weinstein monofilament wire test within normal limits  bilaterally. Muscle power within normal limits bilaterally.  Nails Thick disfigured discolored nails with subungual debris  from hallux to fifth toes bilaterally. No evidence of bacterial infection or drainage bilaterally.  Orthopedic  No limitations of motion  feet .  No crepitus or effusions noted.  No bony pathology or digital deformities noted.  Skin  normotropic skin with no porokeratosis noted bilaterally.  No signs of infections or ulcers noted.   Porokeratosis sub 1 left and sub 2 right. Callus heel right hallux.  Onychomycosis  Pain in right toes  Pain in left toes  Porokeratosis  B/L.  Consent was obtained for treatment procedures.   Mechanical debridement of nails 1-5  bilaterally performed with a nail nipper.  Filed with dremel without incident. Debride porokeratosis with # 15 blade. Patient qualifies for diabetic shoes due to diabetic angiopathy and pre-ulcerous callus  B/L.  Patient to make an appointment with pedorthist.   Return office visit  3 months                  Told patient to return for periodic foot care and evaluation due to potential at risk  complications.   Gardiner Barefoot DPM

## 2021-02-20 ENCOUNTER — Other Ambulatory Visit: Payer: Medicare Other

## 2021-02-21 ENCOUNTER — Other Ambulatory Visit: Payer: Medicare Other

## 2021-05-24 ENCOUNTER — Encounter: Payer: Self-pay | Admitting: Podiatry

## 2021-05-24 ENCOUNTER — Ambulatory Visit (INDEPENDENT_AMBULATORY_CARE_PROVIDER_SITE_OTHER): Payer: Medicare Other | Admitting: Podiatry

## 2021-05-24 DIAGNOSIS — M79674 Pain in right toe(s): Secondary | ICD-10-CM | POA: Diagnosis not present

## 2021-05-24 DIAGNOSIS — E1142 Type 2 diabetes mellitus with diabetic polyneuropathy: Secondary | ICD-10-CM

## 2021-05-24 DIAGNOSIS — M79675 Pain in left toe(s): Secondary | ICD-10-CM

## 2021-05-24 DIAGNOSIS — B351 Tinea unguium: Secondary | ICD-10-CM

## 2021-05-24 DIAGNOSIS — Q828 Other specified congenital malformations of skin: Secondary | ICD-10-CM | POA: Diagnosis not present

## 2021-05-24 DIAGNOSIS — E1159 Type 2 diabetes mellitus with other circulatory complications: Secondary | ICD-10-CM

## 2021-05-24 NOTE — Progress Notes (Signed)
This patient returns to my office for at risk foot care.  This patient requires this care by a professional since this patient will be at risk due to having type 2 diabetes with angiopathy.     This patient is unable to cut nails herself since the patient cannot reach her nails.These nails are painful walking and wearing shoes. Patient  has painful callus both feet. She presents to the office with her daughter.  She requests diabetic shoes. This patient presents for at risk foot care today. ? ?General Appearance  Alert, conversant and in no acute stress. ? ?Vascular  Dorsalis pedis and posterior tibial  pulses are weakly  palpable  bilaterally.  Capillary return is within normal limits  bilaterally. Temperature is within normal limits  bilaterally. ? ?Neurologic  Senn-Weinstein monofilament wire test within normal limits  bilaterally. Muscle power within normal limits bilaterally. ? ?Nails Thick disfigured discolored nails with subungual debris  from hallux to fifth toes bilaterally. No evidence of bacterial infection or drainage bilaterally. ? ?Orthopedic  No limitations of motion  feet .  No crepitus or effusions noted.  No bony pathology or digital deformities noted. ? ?Skin  normotropic skin with no porokeratosis noted bilaterally.  No signs of infections or ulcers noted.   Porokeratosis sub 1 left and sub 2 right.  ? ?Onychomycosis  Pain in right toes  Pain in left toes  Porokeratosis  B/L. ? ?Consent was obtained for treatment procedures.   Mechanical debridement of nails 1-5  bilaterally performed with a nail nipper.  Filed with dremel without incident. Debride porokeratosis with # 15 blade.  ? ? ?Return office visit  3 months                  Told patient to return for periodic foot care and evaluation due to potential at risk complications. ? ? ?Gardiner Barefoot DPM  ?

## 2021-06-01 ENCOUNTER — Telehealth: Payer: Self-pay | Admitting: Hematology and Oncology

## 2021-06-01 NOTE — Telephone Encounter (Signed)
Rescheduled appointment per provider PAL. Left voiceball with the patients daughter Danton Clap.

## 2021-06-23 ENCOUNTER — Ambulatory Visit: Payer: Medicare Other | Admitting: Hematology and Oncology

## 2021-07-20 ENCOUNTER — Other Ambulatory Visit: Payer: Self-pay

## 2021-07-20 ENCOUNTER — Inpatient Hospital Stay: Payer: Medicare Other | Attending: Hematology and Oncology | Admitting: Hematology and Oncology

## 2021-07-20 DIAGNOSIS — C50212 Malignant neoplasm of upper-inner quadrant of left female breast: Secondary | ICD-10-CM | POA: Diagnosis not present

## 2021-07-20 DIAGNOSIS — Z79811 Long term (current) use of aromatase inhibitors: Secondary | ICD-10-CM | POA: Insufficient documentation

## 2021-07-20 DIAGNOSIS — Z170 Estrogen receptor positive status [ER+]: Secondary | ICD-10-CM

## 2021-07-20 DIAGNOSIS — N2889 Other specified disorders of kidney and ureter: Secondary | ICD-10-CM | POA: Diagnosis not present

## 2021-07-20 NOTE — Assessment & Plan Note (Signed)
08/26/2018:Palpable mass 4.2cm in the upper inner left breast at the 11 o'clock position with 4 enlarged axillary lymph nodes. Biopsy on 08/26/18 showed invasivelobularcarcinoma, grade 1-2, HER-2 negative (1+), ER 100%, PR 0%, Ki67 15% in the left breast and axilla. T2N1 stage IIa  CT scans showed the breast cancer and the lymphadenopathy in the axilla and the subpectoral areas. In addition to this she had a left kidney mass measuring 4.5 x 3.5 cm.Could be a primary renal cell cancer. But given her age and health issues no surgical approaches are planned.  Current treatment: Palliative treatment with anastrozole 1 mg daily Surgical appointment with Dr. Ingram: He did not recommend surgery.  Anastrozole toxicities:Denies any adverse effects to anastrozole therapy. 12/22/2020: Mammogram and ultrasound: Slight interval increase in the size of the mass from 3.3 cm to 3.6 cm.  Additionally increase in cortical thickening of multiple left extra lymph nodes 4 mm was 3 mm, largest 1.1 cm. Even though she may be slightly progressing given her age and health issues I do not recommend switching her treatment.  Breast exam:   Therefore I recommended that she can watch it and get a mammogram in September at her annual scheduled time.  Return to clinic in1 year for follow-up 

## 2021-07-20 NOTE — Progress Notes (Signed)
Patient Care Team: Willey Blade, MD as PCP - General (Internal Medicine)  DIAGNOSIS:  Encounter Diagnosis  Name Primary?   Malignant neoplasm of upper-inner quadrant of left breast in female, estrogen receptor positive ([name])     SUMMARY OF ONCOLOGIC HISTORY: Oncology History  Malignant neoplasm of upper-inner quadrant of left breast in female, estrogen receptor positive ([name])  08/26/2018 Initial Diagnosis   Palpable mass 4.2cm in the upper inner left breast at the 11 o'clock position with 4 enlarged axillary lymph nodes. Biopsy on 08/26/18 showed invasive mammary carcinoma, grade 1, HER-2 negative (1+), ER 100%, PR 0%, Ki67 15% in the left breast and axilla.   08/26/2018 Cancer Staging   Staging form: Breast, AJCC 8th Edition - Clinical stage from 08/26/2018: Stage IIB (cT2, cN1, cM0, G1, ER+, PR-, HER2-) - Signed by [name], NP on 09/03/2018   09/02/2018 -  Anti-estrogen oral therapy   Palliative anti-estrogen therapy with anastrozole      CHIEF COMPLIANT: Follow-up of left breat cancer on anastrozole    INTERVAL HISTORY: April Gallagher is a 88 y.o. with above-mentioned history of left breast cancer who is currently on palliative treatment with antiestrogen therapy with anastrozole. She presents to the clinic today for a follow-up. Denies any symptoms in breast. She check her breast all the time and notice that the tumor is growing. She is hard in hearing so you have to speak loud.   ALLERGIES:  is allergic to other.  MEDICATIONS:  Current Outpatient Medications  Medication Sig Dispense Refill   ACCU-CHEK SMARTVIEW test strip      amLODipine (NORVASC) 10 MG tablet Take 10 mg by mouth daily.     anastrozole (ARIMIDEX) 1 MG tablet Take 1 tablet (1 mg total) by mouth daily. 90 tablet 2   atorvastatin (LIPITOR) 40 MG tablet Take 40 mg by mouth at bedtime.      brimonidine (ALPHAGAN) 0.2 % ophthalmic solution INSTILL 1 DROP INTO RIGHT EYE TWICE DAILY     calcium  carbonate (OS-CAL) 600 MG TABS tablet Take 600 mg by mouth every morning.      clotrimazole-betamethasone (LOTRISONE) cream clotrimazole-betamethasone 1 %-0.05 % topical cream     Coenzyme Q10 (CO Q 10 PO) Take 1 tablet by mouth daily.     diclofenac Sodium (VOLTAREN) 1 % GEL diclofenac 1 % topical gel     Flaxseed, Linseed, (FLAXSEED OIL PO) Take 1 tablet by mouth daily.      GARLIC PO Take 1 tablet by mouth daily.      glucosamine-chondroitin 500-400 MG tablet Take 1 tablet by mouth daily.     glucose blood test strip Accu-Chek SmartView Test Strips  CHECK BLOOD SUGAR TWICE  DAILY     ketorolac (TORADOL) 60 MG/2ML SOLN injection ketorolac 60 mg/2 mL intramuscular solution  Inject 60 mg IM x 1     latanoprost (XALATAN) 0.005 % ophthalmic solution Place 1 drop into the right eye at bedtime.      meclizine (ANTIVERT) 25 MG tablet meclizine 25 mg tablet     metFORMIN (GLUCOPHAGE) 500 MG tablet Take 500 mg by mouth 2 (two) times daily with a meal.      predniSONE (DELTASONE) 5 MG tablet prednisone 5 mg tablets in a dose pack  TAKE AS DIRECTED FOR 6 DAYS     No current facility-administered medications for this visit.    PHYSICAL EXAMINATION: ECOG PERFORMANCE STATUS: 1 - Symptomatic but completely ambulatory  Vitals:   07/20/21 1515  BP: (!) 142/71  Pulse: (!) 113  Resp: 19  Temp: 97.7 F (36.5 C)  SpO2: 92%   Filed Weights   07/20/21 1515  Weight: 158 lb 12.8 oz (72 kg)    BREAST: No palpable masses or nodules in either right or left breasts. No palpable axillary supraclavicular or infraclavicular adenopathy no breast tenderness or nipple discharge. (exam performed in the presence of a chaperone)  LABORATORY DATA:  I have reviewed the data as listed    Latest Ref Rng & Units 09/08/2018   12:53 PM 06/03/2009    3:00 PM  CMP  Glucose 70 - 99 mg/dL 89  146   BUN 8 - 23 mg/dL 10  8   Creatinine 0.44 - 1.00 mg/dL 0.75  0.63   Sodium 135 - 145 mmol/L 142  138   Potassium 3.5 -  5.1 mmol/L 3.9  4.0   Chloride 98 - 111 mmol/L 106  105   CO2 22 - 32 mmol/L 27  27   Calcium 8.9 - 10.3 mg/dL 10.0  9.2   Total Protein 6.5 - 8.1 g/dL 7.5  7.3   Total Bilirubin 0.3 - 1.2 mg/dL 0.5  0.5   Alkaline Phos 38 - 126 U/L 61  43   AST 15 - 41 U/L 12  17   ALT 0 - 44 U/L 9  13     Lab Results  Component Value Date   WBC 4.2 09/08/2018   HGB 11.1 (L) 09/08/2018   HCT 35.4 (L) 09/08/2018   MCV 93.7 09/08/2018   PLT 179 09/08/2018   NEUTROABS 2.1 09/08/2018    ASSESSMENT & PLAN:  Malignant neoplasm of upper-inner quadrant of left breast in female, estrogen receptor positive ([name]) 08/26/2018:Palpable mass 4.2cm in the upper inner left breast at the 11 o'clock position with 4 enlarged axillary lymph nodes. Biopsy on 08/26/18 showed invasive lobular carcinoma, grade 1-2, HER-2 negative (1+), ER 100%, PR 0%, Ki67 15% in the left breast and axilla. T2N1 stage IIa   CT scans showed the breast cancer and the lymphadenopathy in the axilla and the subpectoral areas.  In addition to this she had a left kidney mass measuring 4.5 x 3.5 cm.  Could be a primary renal cell cancer.  But given her age and health issues no surgical approaches are planned.   Current treatment: Palliative treatment with anastrozole 1 mg daily    Anastrozole toxicities: Denies any adverse effects to anastrozole therapy. 12/22/2020: Mammogram and ultrasound: Slight interval increase in the size of the mass from 3.3 cm to 3.6 cm.  Additionally increase in cortical thickening of multiple left extra lymph nodes 4 mm was 3 mm, largest 1.1 cm. Even though she may be slightly progressing given her age and health issues I do not recommend switching her treatment.  Breast exam: Left breast palpable mass measuring 3 to 4 cm in size  Therefore I recommended that she can watch it and get a mammogram in September at her annual scheduled time.   Return to clinic in  1 year for follow-up    Orders Placed This Encounter   Procedures   MM DIAG BREAST TOMO BILATERAL    Standing Status:   Future    Standing Expiration Date:   07/21/2022    Order Specific Question:   Reason for Exam (SYMPTOM  OR DIAGNOSIS REQUIRED)    Answer:   ANnual mammograms    Order Specific Question:   Preferred imaging location?  Answer:   [facility]    Order Specific Question:   Release to patient    Answer:   Immediate   MM DIAG BREAST TOMO UNI LEFT    Standing Status:   Future    Standing Expiration Date:   07/21/2022    Order Specific Question:   Reason for Exam (SYMPTOM  OR DIAGNOSIS REQUIRED)    Answer:   Left breast cancer    Order Specific Question:   Preferred imaging location?    Answer:   [facility]   The patient has a good understanding of the overall plan. she agrees with it. she will call with any problems that may develop before the next visit here. Total time spent: 30 mins including face to face time and time spent for planning, charting and co-ordination of care   Harriette Ohara, MD 07/20/21    I Gardiner Coins am scribing for Dr. Lindi Adie  I have reviewed the above documentation for accuracy and completeness, and I agree with the above.

## 2021-07-31 ENCOUNTER — Ambulatory Visit
Admission: RE | Admit: 2021-07-31 | Discharge: 2021-07-31 | Disposition: A | Discharge status: Home / Self Care | Payer: Medicare Other | Source: Ambulatory Visit | Attending: Hematology and Oncology | Admitting: Hematology and Oncology

## 2021-07-31 ENCOUNTER — Other Ambulatory Visit: Payer: Self-pay | Admitting: Hematology and Oncology

## 2021-07-31 DIAGNOSIS — C50212 Malignant neoplasm of upper-inner quadrant of left female breast: Secondary | ICD-10-CM

## 2021-08-09 NOTE — Progress Notes (Signed)
Patient Care Team: Willey Blade, MD as PCP - General (Internal Medicine)  DIAGNOSIS: No diagnosis found.  SUMMARY OF ONCOLOGIC HISTORY: Oncology History  Malignant neoplasm of upper-inner quadrant of left breast in female, estrogen receptor positive ([place])  08/26/2018 Initial Diagnosis   Palpable mass 4.2cm in the upper inner left breast at the 11 o'clock position with 4 enlarged axillary lymph nodes. Biopsy on 08/26/18 showed invasive mammary carcinoma, grade 1, HER-2 negative (1+), ER 100%, PR 0%, Ki67 15% in the left breast and axilla.   08/26/2018 Cancer Staging   Staging form: Breast, AJCC 8th Edition - Clinical stage from 08/26/2018: Stage IIB (cT2, cN1, cM0, G1, ER+, PR-, HER2-) - Signed by [name], NP on 09/03/2018   09/02/2018 -  Anti-estrogen oral therapy   Palliative anti-estrogen therapy with anastrozole      CHIEF COMPLIANT:   INTERVAL HISTORY: NAKAI YARD is a   ALLERGIES:  is allergic to other.  MEDICATIONS:  Current Outpatient Medications  Medication Sig Dispense Refill   ACCU-CHEK SMARTVIEW test strip      amLODipine (NORVASC) 10 MG tablet Take 10 mg by mouth daily.     anastrozole (ARIMIDEX) 1 MG tablet Take 1 tablet (1 mg total) by mouth daily. 90 tablet 2   atorvastatin (LIPITOR) 40 MG tablet Take 40 mg by mouth at bedtime.      brimonidine (ALPHAGAN) 0.2 % ophthalmic solution INSTILL 1 DROP INTO RIGHT EYE TWICE DAILY     calcium carbonate (OS-CAL) 600 MG TABS tablet Take 600 mg by mouth every morning.      clotrimazole-betamethasone (LOTRISONE) cream clotrimazole-betamethasone 1 %-0.05 % topical cream     Coenzyme Q10 (CO Q 10 PO) Take 1 tablet by mouth daily.     diclofenac Sodium (VOLTAREN) 1 % GEL diclofenac 1 % topical gel     Flaxseed, Linseed, (FLAXSEED OIL PO) Take 1 tablet by mouth daily.      GARLIC PO Take 1 tablet by mouth daily.      glucosamine-chondroitin 500-400 MG tablet Take 1 tablet by mouth daily.     glucose blood  test strip Accu-Chek SmartView Test Strips  CHECK BLOOD SUGAR TWICE  DAILY     ketorolac (TORADOL) 60 MG/2ML SOLN injection ketorolac 60 mg/2 mL intramuscular solution  Inject 60 mg IM x 1     latanoprost (XALATAN) 0.005 % ophthalmic solution Place 1 drop into the right eye at bedtime.      meclizine (ANTIVERT) 25 MG tablet meclizine 25 mg tablet     metFORMIN (GLUCOPHAGE) 500 MG tablet Take 500 mg by mouth 2 (two) times daily with a meal.      predniSONE (DELTASONE) 5 MG tablet prednisone 5 mg tablets in a dose pack  TAKE AS DIRECTED FOR 6 DAYS     No current facility-administered medications for this visit.    PHYSICAL EXAMINATION: ECOG PERFORMANCE STATUS: {CHL ONC ECOG PS:[id number]}  There were no vitals filed for this visit. There were no vitals filed for this visit.  BREAST:*** No palpable masses or nodules in either right or left breasts. No palpable axillary supraclavicular or infraclavicular adenopathy no breast tenderness or nipple discharge. (exam performed in the presence of a chaperone)  LABORATORY DATA:  I have reviewed the data as listed    Latest Ref Rng & Units 09/08/2018   12:53 PM 06/03/2009    3:00 PM  CMP  Glucose 70 - 99 mg/dL 89  146   BUN 8 -  23 mg/dL 10  8   Creatinine 0.44 - 1.00 mg/dL 0.75  0.63   Sodium 135 - 145 mmol/L 142  138   Potassium 3.5 - 5.1 mmol/L 3.9  4.0   Chloride 98 - 111 mmol/L 106  105   CO2 22 - 32 mmol/L 27  27   Calcium 8.9 - 10.3 mg/dL 10.0  9.2   Total Protein 6.5 - 8.1 g/dL 7.5  7.3   Total Bilirubin 0.3 - 1.2 mg/dL 0.5  0.5   Alkaline Phos 38 - 126 U/L 61  43   AST 15 - 41 U/L 12  17   ALT 0 - 44 U/L 9  13     Lab Results  Component Value Date   WBC 4.2 09/08/2018   HGB 11.1 (L) 09/08/2018   HCT 35.4 (L) 09/08/2018   MCV 93.7 09/08/2018   PLT 179 09/08/2018   NEUTROABS 2.1 09/08/2018    ASSESSMENT & PLAN:  No problem-specific Assessment & Plan notes found for this encounter.    No orders of the defined types  were placed in this encounter.  The patient has a good understanding of the overall plan. she agrees with it. she will call with any problems that may develop before the next visit here. Total time spent: 30 mins including face to face time and time spent for planning, charting and co-ordination of care   Suzzette Righter, [place] 08/09/21    I Gardiner Coins am scribing for Dr. Lindi Adie  ***

## 2021-08-10 ENCOUNTER — Other Ambulatory Visit: Payer: Self-pay

## 2021-08-10 ENCOUNTER — Inpatient Hospital Stay: Payer: Medicare Other

## 2021-08-10 ENCOUNTER — Inpatient Hospital Stay (HOSPITAL_BASED_OUTPATIENT_CLINIC_OR_DEPARTMENT_OTHER): Payer: Medicare Other | Admitting: Hematology and Oncology

## 2021-08-10 VITALS — BP 151/67 | HR 92 | Temp 97.7°F | Resp 18 | Ht 62.0 in | Wt 158.9 lb

## 2021-08-10 DIAGNOSIS — Z170 Estrogen receptor positive status [ER+]: Secondary | ICD-10-CM

## 2021-08-10 DIAGNOSIS — C50212 Malignant neoplasm of upper-inner quadrant of left female breast: Secondary | ICD-10-CM | POA: Diagnosis not present

## 2021-08-10 LAB — CMP (CANCER CENTER ONLY)
ALT: 8 U/L (ref 0–44)
AST: 13 U/L — ABNORMAL LOW (ref 15–41)
Albumin: 4.3 g/dL (ref 3.5–5.0)
Alkaline Phosphatase: 58 U/L (ref 38–126)
Anion gap: 6 (ref 5–15)
BUN: 18 mg/dL (ref 8–23)
CO2: 29 mmol/L (ref 22–32)
Calcium: 10.1 mg/dL (ref 8.9–10.3)
Chloride: 105 mmol/L (ref 98–111)
Creatinine: 0.69 mg/dL (ref 0.44–1.00)
GFR, Estimated: >60 mL/min (ref >60)
Glucose, Bld: 90 mg/dL (ref 70–99)
Potassium: 3.9 mmol/L (ref 3.5–5.1)
Sodium: 140 mmol/L (ref 135–145)
Total Bilirubin: 0.5 mg/dL (ref 0.3–1.2)
Total Protein: 7.8 g/dL (ref 6.5–8.1)

## 2021-08-10 LAB — CBC WITH DIFFERENTIAL (CANCER CENTER ONLY)
Abs Immature Granulocytes: 0.01 10*3/uL (ref 0.00–0.07)
Basophils Absolute: 0 10*3/uL (ref 0.0–0.1)
Basophils Relative: 1 %
Eosinophils Absolute: 0.1 10*3/uL (ref 0.0–0.5)
Eosinophils Relative: 2 %
HCT: 34.4 % — ABNORMAL LOW (ref 36.0–46.0)
Hemoglobin: 11.2 g/dL — ABNORMAL LOW (ref 12.0–15.0)
Immature Granulocytes: 0 %
Lymphocytes Relative: 35 %
Lymphs Abs: 1.5 10*3/uL (ref 0.7–4.0)
MCH: 30.3 pg (ref 26.0–34.0)
MCHC: 32.6 g/dL (ref 30.0–36.0)
MCV: 93 fL (ref 80.0–100.0)
Monocytes Absolute: 0.4 10*3/uL (ref 0.1–1.0)
Monocytes Relative: 9 %
Neutro Abs: 2.3 10*3/uL (ref 1.7–7.7)
Neutrophils Relative %: 53 %
Platelet Count: 173 10*3/uL (ref 150–400)
RBC: 3.7 MIL/uL — ABNORMAL LOW (ref 3.87–5.11)
RDW: 12.8 % (ref 11.5–15.5)
WBC Count: 4.4 10*3/uL (ref 4.0–10.5)
nRBC: 0 % (ref 0.0–0.2)

## 2021-08-10 NOTE — Assessment & Plan Note (Addendum)
08/26/2018:Palpable mass 4.2cm in the upper inner left breast at the 11 o'clock position with 4 enlarged axillary lymph nodes. Biopsy on 08/26/18 showed invasivelobularcarcinoma, grade 1-2, HER-2 negative (1+), ER 100%, PR 0%, Ki67 15% in the left breast and axilla. T2N1 stage IIa  CT scans showed the breast cancer and the lymphadenopathy in the axilla and the subpectoral areas. In addition to this she had a left kidney mass measuring 4.5 x 3.5 cm.Could be a primary renal cell cancer. But given her age and health issues no surgical approaches are planned.  Current treatment: Palliative treatment with anastrozole 1 mg daily   Anastrozole toxicities:Denies any adverse effects to anastrozole therapy. 12/22/2020: Mammogram and ultrasound: Slight interval increase in the size of the mass from 3.3 cm to 3.6 cm.  Additionally increase in cortical thickening of multiple left extra lymph nodes 4 mm was 3 mm, largest 1.1 cm. Even though she may be slightly progressing given her age and health issues I do not recommend switching her treatment.  Breast cancer surveillance: 1. Breast exam: Left breast palpable mass measuring 3 to 4 cm in size 2. left breast mammogram and ultrasound 07/31/2021: Enlarging mass 11 o'clock position left breast 4 cm (December it was 3.3 cm).  Enlarging and numerous abnormal left axillary lymph nodes at least 6  I discussed with the patient about switching from anastrozole to Faslodex injections.  We can also look to see if she has an ESR 1 mutation by [employer] so that she could be treated with Elacestrant. We plan to obtain CT chest abdomen pelvis with contrast. We will check her labs today and decide if she can tolerate contrast   Return to clinic inday 15 of Faslodex

## 2021-08-10 NOTE — Addendum Note (Signed)
Addended by: Nicholas Lose on: 08/10/2021 02:28 PM   Modules accepted: Orders

## 2021-08-14 ENCOUNTER — Telehealth: Payer: Self-pay | Admitting: Hematology and Oncology

## 2021-08-14 NOTE — Telephone Encounter (Signed)
Scheduled appointment per 7/27 los. Talked with the patients daughter Danton Clap and she is aware of the upcoming appointment.

## 2021-08-18 ENCOUNTER — Inpatient Hospital Stay: Payer: Medicare Other | Attending: Hematology and Oncology

## 2021-08-18 ENCOUNTER — Other Ambulatory Visit: Payer: Self-pay

## 2021-08-18 VITALS — BP 164/67 | HR 93 | Temp 99.1°F | Resp 18

## 2021-08-18 DIAGNOSIS — Z170 Estrogen receptor positive status [ER+]: Secondary | ICD-10-CM | POA: Insufficient documentation

## 2021-08-18 DIAGNOSIS — C50212 Malignant neoplasm of upper-inner quadrant of left female breast: Secondary | ICD-10-CM | POA: Diagnosis present

## 2021-08-18 DIAGNOSIS — Z79811 Long term (current) use of aromatase inhibitors: Secondary | ICD-10-CM | POA: Insufficient documentation

## 2021-08-18 DIAGNOSIS — Z5111 Encounter for antineoplastic chemotherapy: Secondary | ICD-10-CM | POA: Diagnosis present

## 2021-08-18 MED ORDER — FULVESTRANT 250 MG/5ML IM SOSY
500.0000 mg | PREFILLED_SYRINGE | Freq: Once | INTRAMUSCULAR | Status: AC
  Administered 2021-08-18: 500 mg via INTRAMUSCULAR
  Filled 2021-08-18: qty 10

## 2021-08-24 ENCOUNTER — Ambulatory Visit (HOSPITAL_COMMUNITY)
Admission: RE | Admit: 2021-08-24 | Discharge: 2021-08-24 | Disposition: A | Discharge status: Home / Self Care | Payer: Medicare Other | Source: Ambulatory Visit | Attending: Hematology and Oncology | Admitting: Hematology and Oncology

## 2021-08-24 DIAGNOSIS — C50212 Malignant neoplasm of upper-inner quadrant of left female breast: Secondary | ICD-10-CM | POA: Insufficient documentation

## 2021-08-24 DIAGNOSIS — Z170 Estrogen receptor positive status [ER+]: Secondary | ICD-10-CM | POA: Insufficient documentation

## 2021-08-24 MED ORDER — IOHEXOL 300 MG/ML  SOLN
100.0000 mL | Freq: Once | INTRAMUSCULAR | Status: AC | PRN
  Administered 2021-08-24: 100 mL via INTRAVENOUS

## 2021-08-31 NOTE — Progress Notes (Signed)
Patient Care Team: April Blade, MD as PCP - General (Internal Medicine)  DIAGNOSIS:  Encounter Diagnosis  Name Primary?   Malignant neoplasm of upper-inner quadrant of left breast in female, estrogen receptor positive ([place])     SUMMARY OF ONCOLOGIC HISTORY: Oncology History  Malignant neoplasm of upper-inner quadrant of left breast in female, estrogen receptor positive ([place])  08/26/2018 Initial Diagnosis   Palpable mass 4.2cm in the upper inner left breast at the 11 o'clock position with 4 enlarged axillary lymph nodes. Biopsy on 08/26/18 showed invasive mammary carcinoma, grade 1, HER-2 negative (1+), ER 100%, PR 0%, Ki67 15% in the left breast and axilla.   08/26/2018 Cancer Staging   Staging form: Breast, AJCC 8th Edition - Clinical stage from 08/26/2018: Stage IIB (cT2, cN1, cM0, G1, ER+, PR-, HER2-) - Signed by [name], NP on 09/03/2018   09/02/2018 -  Anti-estrogen oral therapy   Palliative anti-estrogen therapy with anastrozole      CHIEF COMPLIANT: Follow-up Faslodex  INTERVAL HISTORY: April Gallagher is a 88 y.o. with above-mentioned history of left breast cancer who is currently on palliative treatment with antiestrogen therapy with anastrozole. She presents to the clinic today for a follow-up. She states that she did well with the Faslodex. Denies pain and soreness from it. No new pain or symptoms. She is tolerating the Faslodex extremely well.   ALLERGIES:  is allergic to other.  MEDICATIONS:  Current Outpatient Medications  Medication Sig Dispense Refill   ACCU-CHEK SMARTVIEW test strip      amLODipine (NORVASC) 10 MG tablet Take 10 mg by mouth daily.     atorvastatin (LIPITOR) 40 MG tablet Take 40 mg by mouth at bedtime.      brimonidine (ALPHAGAN) 0.2 % ophthalmic solution INSTILL 1 DROP INTO RIGHT EYE TWICE DAILY     calcium carbonate (OS-CAL) 600 MG TABS tablet Take 600 mg by mouth every morning.      clotrimazole-betamethasone (LOTRISONE)  cream clotrimazole-betamethasone 1 %-0.05 % topical cream     Coenzyme Q10 (CO Q 10 PO) Take 1 tablet by mouth daily.     diclofenac Sodium (VOLTAREN) 1 % GEL diclofenac 1 % topical gel     Flaxseed, Linseed, (FLAXSEED OIL PO) Take 1 tablet by mouth daily.      GARLIC PO Take 1 tablet by mouth daily.      glucosamine-chondroitin 500-400 MG tablet Take 1 tablet by mouth daily.     glucose blood test strip Accu-Chek SmartView Test Strips  CHECK BLOOD SUGAR TWICE  DAILY     ketorolac (TORADOL) 60 MG/2ML SOLN injection ketorolac 60 mg/2 mL intramuscular solution  Inject 60 mg IM x 1     latanoprost (XALATAN) 0.005 % ophthalmic solution Place 1 drop into the right eye at bedtime.      meclizine (ANTIVERT) 25 MG tablet meclizine 25 mg tablet     metFORMIN (GLUCOPHAGE) 500 MG tablet Take 500 mg by mouth 2 (two) times daily with a meal.      predniSONE (DELTASONE) 5 MG tablet prednisone 5 mg tablets in a dose pack  TAKE AS DIRECTED FOR 6 DAYS     No current facility-administered medications for this visit.    PHYSICAL EXAMINATION: ECOG PERFORMANCE STATUS: 1 - Symptomatic but completely ambulatory  Vitals:   09/01/21 1222  BP: (!) 142/53  Pulse: 86  Temp: 97.9 F (36.6 C)  SpO2: 100%   Filed Weights   09/01/21 1222  Weight: 160 lb 3.2  oz (72.7 kg)     LABORATORY DATA:  I have reviewed the data as listed    Latest Ref Rng & Units 08/10/2021    2:43 PM 09/08/2018   12:53 PM 06/03/2009    3:00 PM  CMP  Glucose 70 - 99 mg/dL 90  89  146   BUN 8 - 23 mg/dL _0 Creatinine 0.44 - 1.00 mg/dL 0.69  0.75  0.63   Sodium 135 - 145 mmol/L 140  142  138   Potassium 3.5 - 5.1 mmol/L 3.9  3.9  4.0   Chloride 98 - 111 mmol/L 105  106  105   CO2 22 - 32 mmol/L _1 Calcium 8.9 - 10.3 mg/dL 10.1  10.0  9.2   Total Protein 6.5 - 8.1 g/dL 7.8  7.5  7.3   Total Bilirubin 0.3 - 1.2 mg/dL 0.5  0.5  0.5   Alkaline Phos 38 - 126 U/L 58  61  43   AST 15 - 41 U/L _2 ALT 0 -  44 U/L _3 Lab Results  Component Value Date   WBC 4.4 08/10/2021   HGB 11.2 (L) 08/10/2021   HCT 34.4 (L) 08/10/2021   MCV 93.0 08/10/2021   PLT 173 08/10/2021   NEUTROABS 2.3 08/10/2021    ASSESSMENT & PLAN:  Malignant neoplasm of upper-inner quadrant of left breast in female, estrogen receptor positive ([place]) 08/26/2018:Palpable mass 4.2cm in the upper inner left breast at the 11 o'clock position with 4 enlarged axillary lymph nodes. Biopsy on 08/26/18 showed invasive lobular carcinoma, grade 1-2, HER-2 negative (1+), ER 100%, PR 0%, Ki67 15% in the left breast and axilla. T2N1 stage IIa   CT scans showed the breast cancer and the lymphadenopathy in the axilla and the subpectoral areas.  In addition to this she had a left kidney mass measuring 4.5 x 3.5 cm.  Could be a primary renal cell cancer.  But given her age and health issues no surgical approaches are planned.   Current treatment: Palliative treatment with anastrozole 1 mg daily discontinued 08/10/2021 switched to Faslodex August 2023    12/22/2020: Mammogram and ultrasound: Slight interval increase in the size of the mass from 3.3 cm to 3.6 cm.  Additionally increase in cortical thickening of multiple left extra lymph nodes 4 mm was 3 mm, largest 1.1 cm. 08/25/2021: CT CAP: 2.6 cm left breast mass with adjacent medial nodule and mildly enlarged left axillary lymph nodes.  No distant metastatic disease.  Stable 4.1 cm left renal mass consistent with renal cell cancer.   Breast cancer surveillance: 1. Breast exam: Left breast palpable mass measuring 3 to 4 cm in size 2. left breast mammogram and ultrasound 07/31/2021: Enlarging mass 11 o'clock position left breast 4 cm (December it was 3.3 cm).  Enlarging and numerous abnormal left axillary lymph nodes at least 6  Faslodex toxicities:  Goal of treatment: Palliation Return to clinic in 2 weeks for Faslodex injection after that monthly thereafter    No orders of the  defined types were placed in this encounter.  The patient has a good understanding of the overall plan. she agrees with it. she will call with any problems that may develop before the next visit here. Total time spent: 30 mins including face to face time and time spent for planning, charting and co-ordination of  care   Harriette Ohara, MD 09/01/21    I Gardiner Coins am scribing for Dr. Lindi Adie  I have reviewed the above documentation for accuracy and completeness, and I agree with the above.

## 2021-09-01 ENCOUNTER — Inpatient Hospital Stay: Payer: Medicare Other

## 2021-09-01 ENCOUNTER — Other Ambulatory Visit: Payer: Self-pay

## 2021-09-01 ENCOUNTER — Inpatient Hospital Stay: Payer: Medicare Other | Admitting: Hematology and Oncology

## 2021-09-01 DIAGNOSIS — C50212 Malignant neoplasm of upper-inner quadrant of left female breast: Secondary | ICD-10-CM

## 2021-09-01 DIAGNOSIS — Z5111 Encounter for antineoplastic chemotherapy: Secondary | ICD-10-CM | POA: Diagnosis not present

## 2021-09-01 DIAGNOSIS — Z170 Estrogen receptor positive status [ER+]: Secondary | ICD-10-CM

## 2021-09-01 MED ORDER — FULVESTRANT 250 MG/5ML IM SOSY
500.0000 mg | PREFILLED_SYRINGE | Freq: Once | INTRAMUSCULAR | Status: AC
  Administered 2021-09-01: 500 mg via INTRAMUSCULAR
  Filled 2021-09-01: qty 10

## 2021-09-01 NOTE — Patient Instructions (Signed)

## 2021-09-01 NOTE — Assessment & Plan Note (Signed)
08/26/2018:Palpable mass 4.2cm in the upper inner left breast at the 11 o'clock position with 4 enlarged axillary lymph nodes. Biopsy on 08/26/18 showed invasivelobularcarcinoma, grade 1-2, HER-2 negative (1+), ER 100%, PR 0%, Ki67 15% in the left breast and axilla. T2N1 stage IIa  CT scans showed the breast cancer and the lymphadenopathy in the axilla and the subpectoral areas. In addition to this she had a left kidney mass measuring 4.5 x 3.5 cm.Could be a primary renal cell cancer. But given her age and health issues no surgical approaches are planned.  Current treatment: Palliative treatment with anastrozole 1 mg daily discontinued 08/10/2021 switched to Faslodex August 2023   12/22/2020: Mammogram and ultrasound: Slight interval increase in the size of the mass from 3.3 cm to 3.6 cm. Additionally increase in cortical thickening of multiple left extra lymph nodes 4 mm was 3 mm, largest 1.1 cm. 08/25/2021: CT CAP: 2.6 cm left breast mass with adjacent medial nodule and mildly enlarged left axillary lymph nodes.  No distant metastatic disease.  Stable 4.1 cm left renal mass consistent with renal cell cancer.  Breast cancer surveillance: 1. Breast exam:Left breast palpable mass measuring 3 to 4 cm in size 2. left breast mammogram and ultrasound 07/31/2021: Enlarging mass 11 o'clock position left breast 4 cm (December it was 3.3 cm).  Enlarging and numerous abnormal left axillary lymph nodes at least 6  Faslodex toxicities:  Goal of treatment: Palliation Return to clinic in 2 weeks for Faslodex injection after that monthly thereafter

## 2021-09-14 ENCOUNTER — Other Ambulatory Visit: Payer: Self-pay

## 2021-09-14 ENCOUNTER — Inpatient Hospital Stay: Payer: Medicare Other

## 2021-09-14 VITALS — BP 138/69 | HR 74 | Temp 98.7°F | Resp 18

## 2021-09-14 DIAGNOSIS — Z5111 Encounter for antineoplastic chemotherapy: Secondary | ICD-10-CM | POA: Diagnosis not present

## 2021-09-14 DIAGNOSIS — Z170 Estrogen receptor positive status [ER+]: Secondary | ICD-10-CM

## 2021-09-14 MED ORDER — FULVESTRANT 250 MG/5ML IM SOSY
500.0000 mg | PREFILLED_SYRINGE | Freq: Once | INTRAMUSCULAR | Status: AC
  Administered 2021-09-14: 500 mg via INTRAMUSCULAR
  Filled 2021-09-14: qty 10

## 2021-09-14 NOTE — Patient Instructions (Signed)

## 2021-09-15 ENCOUNTER — Encounter: Payer: Self-pay | Admitting: Hematology and Oncology

## 2021-09-25 ENCOUNTER — Ambulatory Visit: Payer: Medicare Other | Admitting: Podiatry

## 2021-09-25 ENCOUNTER — Encounter: Payer: Self-pay | Admitting: Podiatry

## 2021-09-25 DIAGNOSIS — M79675 Pain in left toe(s): Secondary | ICD-10-CM

## 2021-09-25 DIAGNOSIS — M79674 Pain in right toe(s): Secondary | ICD-10-CM

## 2021-09-25 DIAGNOSIS — Q828 Other specified congenital malformations of skin: Secondary | ICD-10-CM | POA: Diagnosis not present

## 2021-09-25 DIAGNOSIS — E1159 Type 2 diabetes mellitus with other circulatory complications: Secondary | ICD-10-CM

## 2021-09-25 DIAGNOSIS — B351 Tinea unguium: Secondary | ICD-10-CM | POA: Diagnosis not present

## 2021-09-25 LAB — GUARDANT 360

## 2021-09-25 NOTE — Progress Notes (Signed)
This patient returns to my office for at risk foot care.  This patient requires this care by a professional since this patient will be at risk due to having type 2 diabetes with angiopathy.     This patient is unable to cut nails herself since the patient cannot reach her nails.These nails are painful walking and wearing shoes. Patient  has painful callus both feet. She presents to the office with her daughter.  She requests diabetic shoes. This patient presents for at risk foot care today.  General Appearance  Alert, conversant and in no acute stress.  Vascular  Dorsalis pedis and posterior tibial  pulses are weakly  palpable  bilaterally.  Capillary return is within normal limits  bilaterally. Temperature is within normal limits  bilaterally.  Neurologic  Senn-Weinstein monofilament wire test within normal limits  bilaterally. Muscle power within normal limits bilaterally.  Nails Thick disfigured discolored nails with subungual debris  from hallux to fifth toes bilaterally. No evidence of bacterial infection or drainage bilaterally.  Orthopedic  No limitations of motion  feet .  No crepitus or effusions noted.  No bony pathology or digital deformities noted.  Skin  normotropic skin with no porokeratosis noted bilaterally.  No signs of infections or ulcers noted.   Porokeratosis sub 1 left and sub 2 right.   Onychomycosis  Pain in right toes  Pain in left toes  Porokeratosis  B/L.  Consent was obtained for treatment procedures.   Mechanical debridement of nails 1-5  bilaterally performed with a nail nipper.  Filed with dremel without incident. Debride porokeratosis with # 15 blade.    Return office visit  4   months                  Told patient to return for periodic foot care and evaluation due to potential at risk complications.   Gardiner Barefoot DPM

## 2021-10-09 NOTE — Progress Notes (Signed)
Patient Care Team: Willey Blade, MD as PCP - General (Internal Medicine)  DIAGNOSIS: No diagnosis found.  SUMMARY OF ONCOLOGIC HISTORY: Oncology History  Malignant neoplasm of upper-inner quadrant of left breast in female, estrogen receptor positive ([place])  08/26/2018 Initial Diagnosis   Palpable mass 4.2cm in the upper inner left breast at the 11 o'clock position with 4 enlarged axillary lymph nodes. Biopsy on 08/26/18 showed invasive mammary carcinoma, grade 1, HER-2 negative (1+), ER 100%, PR 0%, Ki67 15% in the left breast and axilla.   08/26/2018 Cancer Staging   Staging form: Breast, AJCC 8th Edition - Clinical stage from 08/26/2018: Stage IIB (cT2, cN1, cM0, G1, ER+, PR-, HER2-) - Signed by [name], NP on 09/03/2018   09/02/2018 -  Anti-estrogen oral therapy   Palliative anti-estrogen therapy with anastrozole      CHIEF COMPLIANT: Follow-up left breast cancer on Faslodex  INTERVAL HISTORY: April Gallagher is a  88 y.o. with above-mentioned history of left breast cancer who is currently on palliative treatment. She presents to the clinic today for a Faslodex follow-up.    ALLERGIES:  is allergic to other.  MEDICATIONS:  Current Outpatient Medications  Medication Sig Dispense Refill   ACCU-CHEK SMARTVIEW test strip      amLODipine (NORVASC) 10 MG tablet Take 10 mg by mouth daily.     atorvastatin (LIPITOR) 40 MG tablet Take 40 mg by mouth at bedtime.      brimonidine (ALPHAGAN) 0.2 % ophthalmic solution INSTILL 1 DROP INTO RIGHT EYE TWICE DAILY     calcium carbonate (OS-CAL) 600 MG TABS tablet Take 600 mg by mouth every morning.      clotrimazole-betamethasone (LOTRISONE) cream clotrimazole-betamethasone 1 %-0.05 % topical cream     Coenzyme Q10 (CO Q 10 PO) Take 1 tablet by mouth daily.     diclofenac Sodium (VOLTAREN) 1 % GEL diclofenac 1 % topical gel     Flaxseed, Linseed, (FLAXSEED OIL PO) Take 1 tablet by mouth daily.      GARLIC PO Take 1 tablet by  mouth daily.      glucosamine-chondroitin 500-400 MG tablet Take 1 tablet by mouth daily.     glucose blood test strip Accu-Chek SmartView Test Strips  CHECK BLOOD SUGAR TWICE  DAILY     ketorolac (TORADOL) 60 MG/2ML SOLN injection ketorolac 60 mg/2 mL intramuscular solution  Inject 60 mg IM x 1     latanoprost (XALATAN) 0.005 % ophthalmic solution Place 1 drop into the right eye at bedtime.      meclizine (ANTIVERT) 25 MG tablet meclizine 25 mg tablet     metFORMIN (GLUCOPHAGE) 500 MG tablet Take 500 mg by mouth 2 (two) times daily with a meal.      predniSONE (DELTASONE) 5 MG tablet prednisone 5 mg tablets in a dose pack  TAKE AS DIRECTED FOR 6 DAYS     No current facility-administered medications for this visit.    PHYSICAL EXAMINATION: ECOG PERFORMANCE STATUS: {CHL ONC ECOG PS:[id number]}  There were no vitals filed for this visit. There were no vitals filed for this visit.  BREAST:*** No palpable masses or nodules in either right or left breasts. No palpable axillary supraclavicular or infraclavicular adenopathy no breast tenderness or nipple discharge. (exam performed in the presence of a chaperone)  LABORATORY DATA:  I have reviewed the data as listed    Latest Ref Rng & Units 08/10/2021    2:43 PM 09/08/2018   12:53 PM 06/03/2009  3:00 PM  CMP  Glucose 70 - 99 mg/dL 90  89  146   BUN 8 - 23 mg/dL 18  10  8    Creatinine 0.44 - 1.00 mg/dL 0.69  0.75  0.63   Sodium 135 - 145 mmol/L 140  142  138   Potassium 3.5 - 5.1 mmol/L 3.9  3.9  4.0   Chloride 98 - 111 mmol/L 105  106  105   CO2 22 - 32 mmol/L 29  27  27    Calcium 8.9 - 10.3 mg/dL 10.1  10.0  9.2   Total Protein 6.5 - 8.1 g/dL 7.8  7.5  7.3   Total Bilirubin 0.3 - 1.2 mg/dL 0.5  0.5  0.5   Alkaline Phos 38 - 126 U/L 58  61  43   AST 15 - 41 U/L 13  12  17    ALT 0 - 44 U/L 8  9  13      Lab Results  Component Value Date   WBC 4.4 08/10/2021   HGB 11.2 (L) 08/10/2021   HCT 34.4 (L) 08/10/2021   MCV 93.0  08/10/2021   PLT 173 08/10/2021   NEUTROABS 2.3 08/10/2021    ASSESSMENT & PLAN:  No problem-specific Assessment & Plan notes found for this encounter.    No orders of the defined types were placed in this encounter.  The patient has a good understanding of the overall plan. she agrees with it. she will call with any problems that may develop before the next visit here. Total time spent: 30 mins including face to face time and time spent for planning, charting and co-ordination of care   Suzzette Righter, [place] 10/09/21    I Gardiner Coins am scribing for Dr. Lindi Adie  ***

## 2021-10-12 ENCOUNTER — Inpatient Hospital Stay: Payer: Medicare Other

## 2021-10-12 ENCOUNTER — Other Ambulatory Visit: Payer: Self-pay

## 2021-10-12 ENCOUNTER — Inpatient Hospital Stay: Payer: Medicare Other | Attending: Hematology and Oncology | Admitting: Hematology and Oncology

## 2021-10-12 VITALS — BP 156/55 | HR 82 | Temp 97.3°F | Resp 18 | Ht 62.0 in | Wt 161.7 lb

## 2021-10-12 DIAGNOSIS — Z5111 Encounter for antineoplastic chemotherapy: Secondary | ICD-10-CM | POA: Insufficient documentation

## 2021-10-12 DIAGNOSIS — Z170 Estrogen receptor positive status [ER+]: Secondary | ICD-10-CM | POA: Diagnosis not present

## 2021-10-12 DIAGNOSIS — C50212 Malignant neoplasm of upper-inner quadrant of left female breast: Secondary | ICD-10-CM | POA: Diagnosis present

## 2021-10-12 MED ORDER — FULVESTRANT 250 MG/5ML IM SOSY
500.0000 mg | PREFILLED_SYRINGE | Freq: Once | INTRAMUSCULAR | Status: AC
  Administered 2021-10-12: 500 mg via INTRAMUSCULAR
  Filled 2021-10-12: qty 10

## 2021-10-12 NOTE — Assessment & Plan Note (Signed)
08/26/2018:Palpable mass 4.2cm in the upper inner left breast at the 11 o'clock position with 4 enlarged axillary lymph nodes. Biopsy on 08/26/18 showed invasivelobularcarcinoma, grade 1-2, HER-2 negative (1+), ER 100%, PR 0%, Ki67 15% in the left breast and axilla. T2N1 stage IIa  CT scans showed the breast cancer and the lymphadenopathy in the axilla and the subpectoral areas. In addition to this she had a left kidney mass measuring 4.5 x 3.5 cm.Could be a primary renal cell cancer. But given her age and health issues no surgical approaches are planned.  Current treatment: Palliative treatment with anastrozole 1 mg daily discontinued 08/10/2021 switched to Faslodex August 2023  Breast cancer surveillance: 1. Breast exam:Left breast palpable mass measuring 3 to 4 cm in size 2. left breast mammogram and ultrasound 07/31/2021: Enlarging mass 4 cm enlarging numerous left axillary lymph nodes at least 6  Return to clinic monthly for Faslodex injections Repeat mammogram in 6 months  

## 2021-11-09 ENCOUNTER — Inpatient Hospital Stay: Payer: Medicare Other | Attending: Hematology and Oncology

## 2021-11-09 ENCOUNTER — Other Ambulatory Visit: Payer: Self-pay

## 2021-11-09 VITALS — BP 148/69 | HR 70 | Temp 99.1°F | Resp 18

## 2021-11-09 DIAGNOSIS — Z5111 Encounter for antineoplastic chemotherapy: Secondary | ICD-10-CM | POA: Insufficient documentation

## 2021-11-09 DIAGNOSIS — C50212 Malignant neoplasm of upper-inner quadrant of left female breast: Secondary | ICD-10-CM | POA: Insufficient documentation

## 2021-11-09 DIAGNOSIS — Z170 Estrogen receptor positive status [ER+]: Secondary | ICD-10-CM | POA: Diagnosis not present

## 2021-11-09 MED ORDER — FULVESTRANT 250 MG/5ML IM SOSY
500.0000 mg | PREFILLED_SYRINGE | Freq: Once | INTRAMUSCULAR | Status: AC
  Administered 2021-11-09: 500 mg via INTRAMUSCULAR
  Filled 2021-11-09: qty 10

## 2021-11-13 ENCOUNTER — Other Ambulatory Visit: Payer: Self-pay | Admitting: Hematology and Oncology

## 2021-11-13 ENCOUNTER — Ambulatory Visit
Admission: RE | Admit: 2021-11-13 | Discharge: 2021-11-13 | Disposition: A | Discharge status: Home / Self Care | Payer: Medicare Other | Source: Ambulatory Visit | Attending: Hematology and Oncology | Admitting: Hematology and Oncology

## 2021-11-13 DIAGNOSIS — C50212 Malignant neoplasm of upper-inner quadrant of left female breast: Secondary | ICD-10-CM

## 2021-11-21 IMAGING — US US BREAST*L* LIMITED INC AXILLA
1 series · 13 of 17 positions shown · non-contrast
Comparison: Previous exam(s).

CLINICAL DATA: [AGE] female with known left breast cancer
metastatic to axillary lymph nodes.

EXAM:
DIGITAL DIAGNOSTIC BILATERAL MAMMOGRAM WITH CAD AND TOMO
ULTRASOUND LEFT BREAST

[Series 1: us breast*left* limited inc axilla · 0.07mm/px · 13 of 17 slices shown]
[im 1/17]
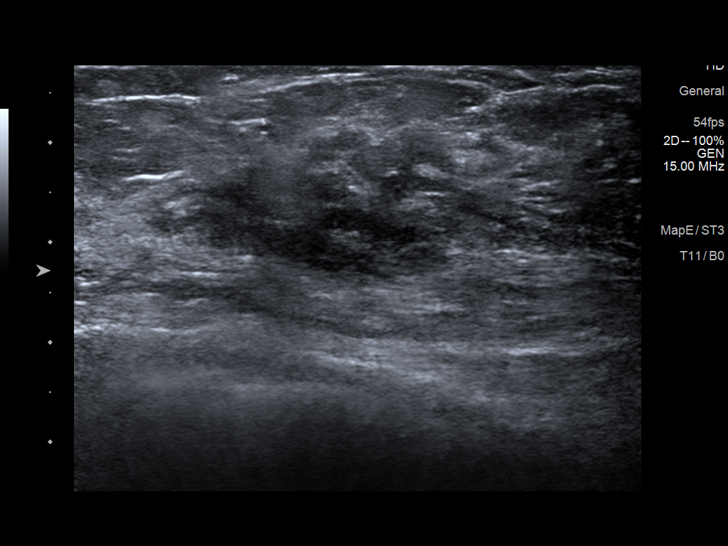
[im 2/17]
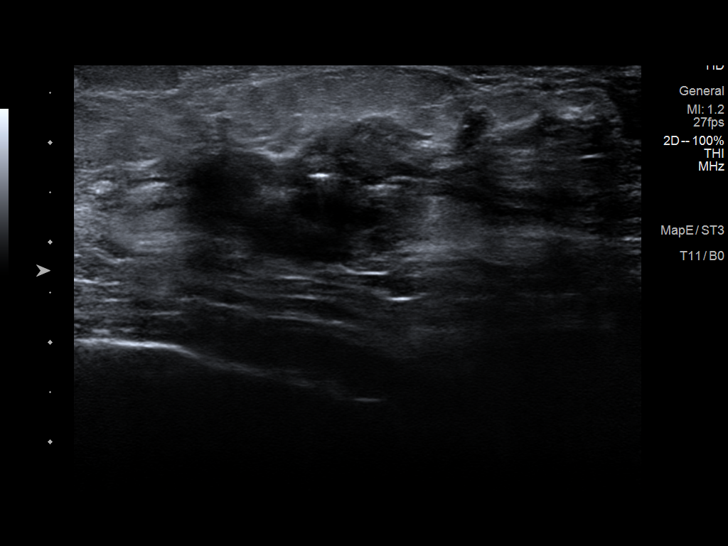
[im 4/17]
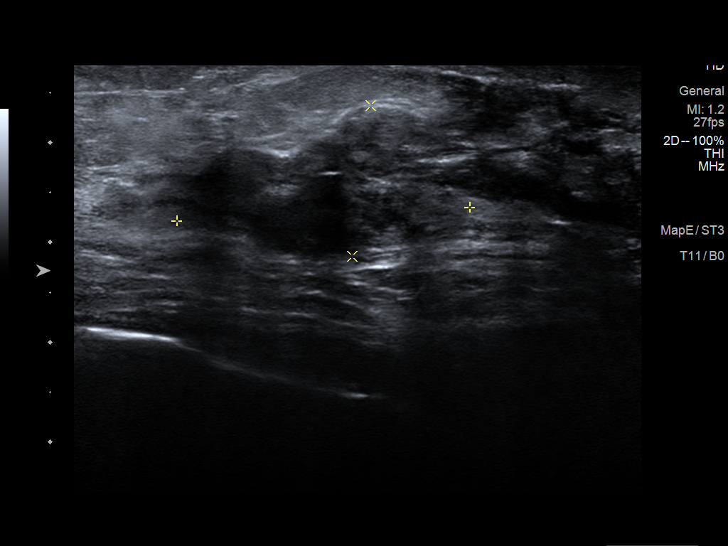
[im 5/17]
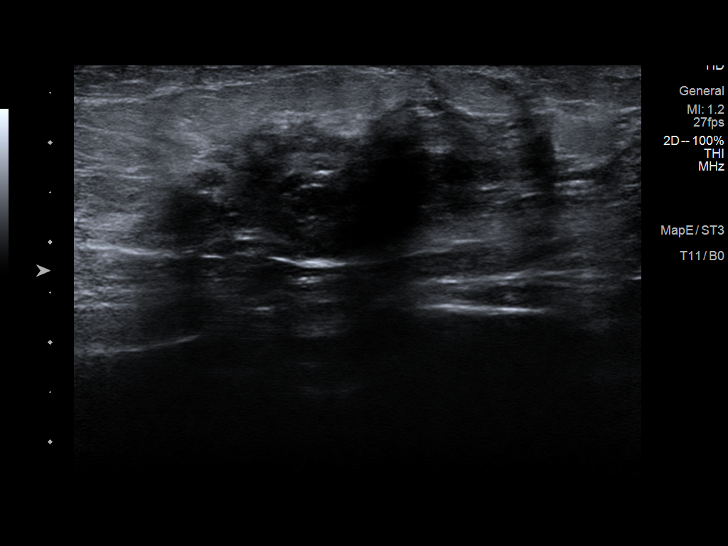
[im 6/17]
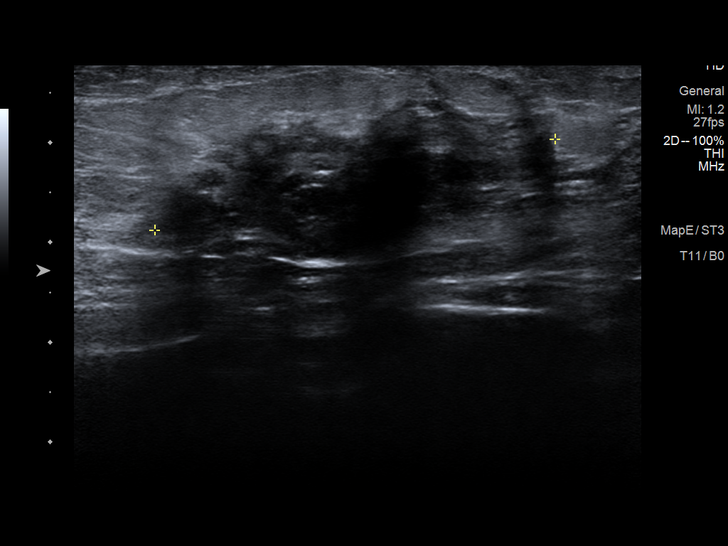
[im 8/17]
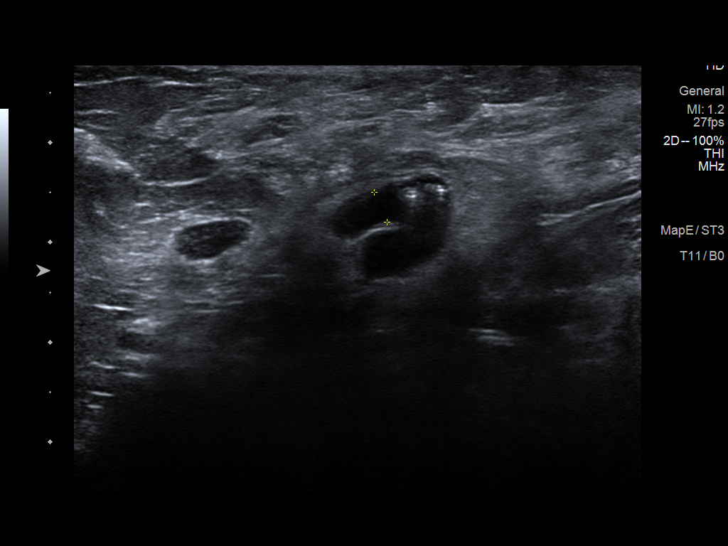
[im 9/17]
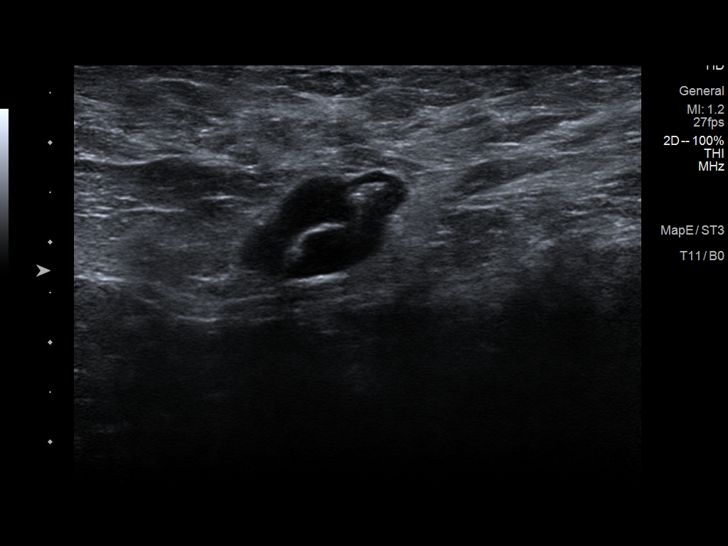
[im 10/17]
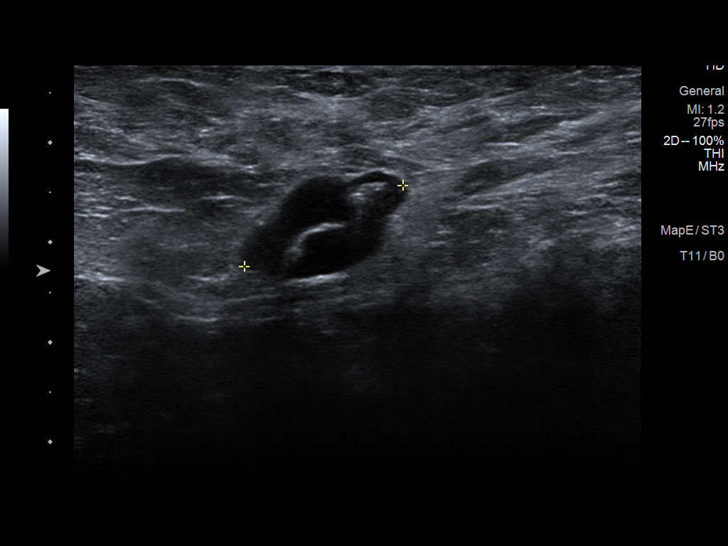
[im 12/17]
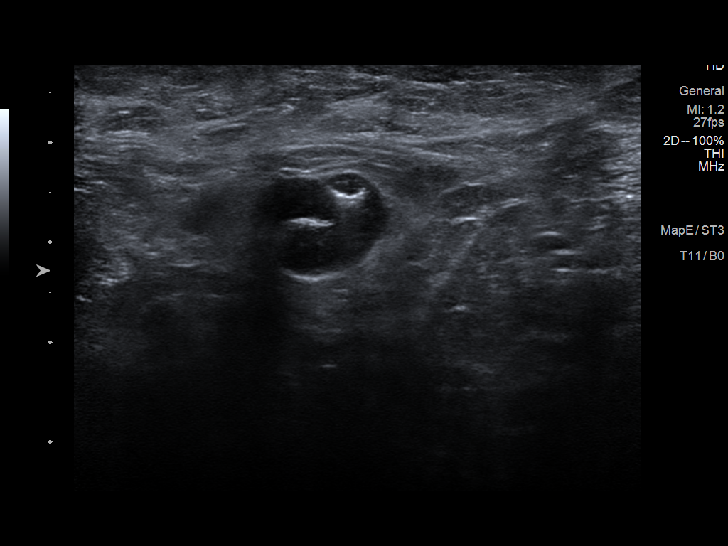
[im 13/17]
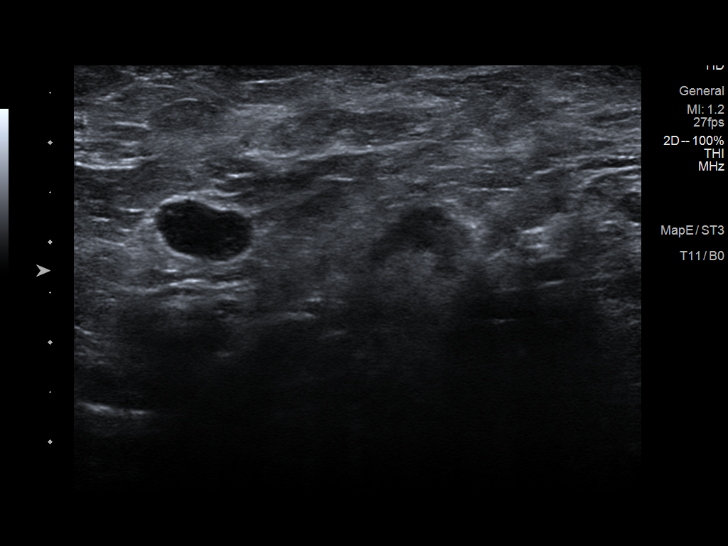
[im 14/17]
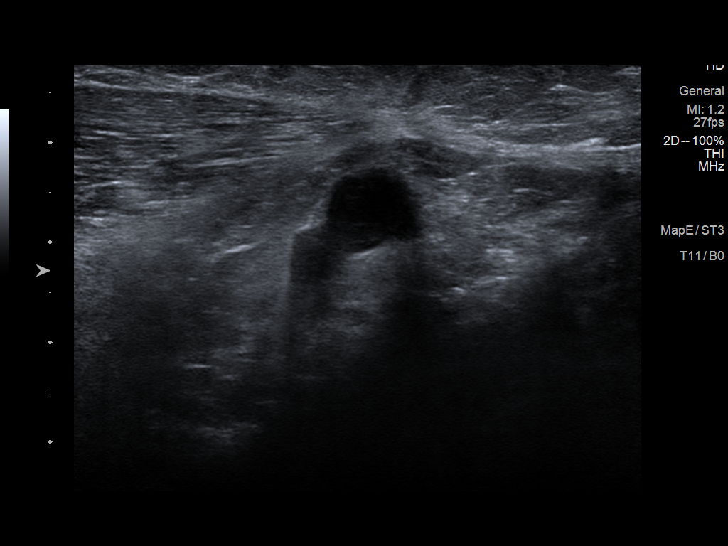
[im 16/17]
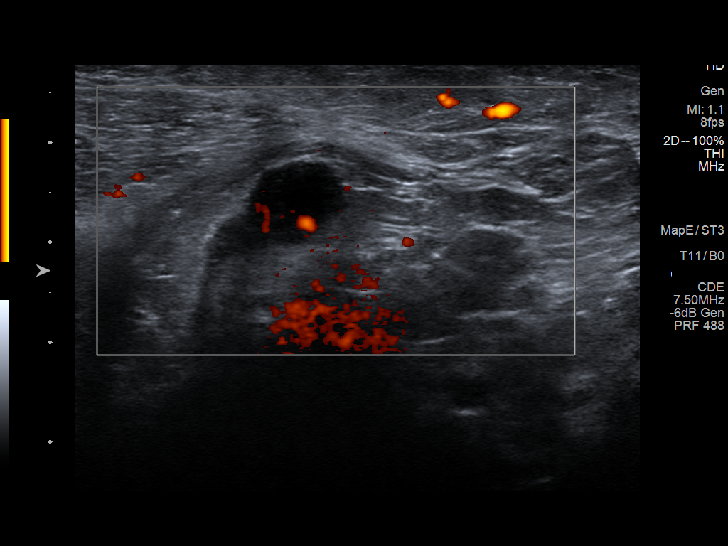
[im 17/17]
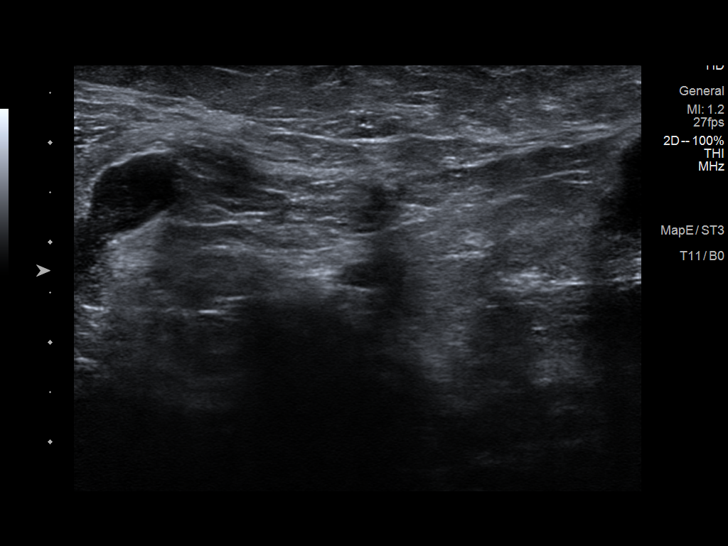

[13 of 17 positions shown; findings below may reference images not displayed]

ACR Breast Density Category c: The breast tissue is heterogeneously
dense, which may obscure small masses.
FINDINGS: Stable appearance of a focal asymmetry with associated post biopsy
clip in the central left breast at anterior depth. No additional
suspicious mammographic findings are identified in either breast.
The parenchymal pattern is stable.

Mammographic images were processed with CAD.

Targeted ultrasound is performed, showing a in the irregular,
hypoechoic mass at the 11 o'clock position 2 cm from the nipple on
the right. Today it measures 4.1 x 2.9 x 1.5 cm (previously 4.2 x
3.4 x 2.1 cm). Evaluation of the left axilla demonstrates multiple
prominent lymph nodes with cortical thickening measuring up to 3 mm.
These appear improved compared to the prior study, at which time the
same lymph node measured cortical thickness up to 10 mm.
IMPRESSION: 1. Interval decrease in size of the patient's left breast mass and
axillary adenopathy corresponding with biopsy-proven cancer.
2. No mammographic evidence of malignancy on the right.

RECOMMENDATION:
Per clinical treatment plan.

I have discussed the findings and recommendations with the patient.
If applicable, a reminder letter will be sent to the patient
regarding the next appointment.

BI-RADS CATEGORY  6: Known biopsy-proven malignancy.

## 2021-12-10 NOTE — Progress Notes (Signed)
Patient Care Team: Willey Blade, MD as PCP - General (Internal Medicine)  DIAGNOSIS:  Encounter Diagnosis  Name Primary?   Malignant neoplasm of upper-inner quadrant of left breast in female, estrogen receptor positive ([place]) Yes    SUMMARY OF ONCOLOGIC HISTORY: Oncology History  Malignant neoplasm of upper-inner quadrant of left breast in female, estrogen receptor positive ([place])  08/26/2018 Initial Diagnosis   Palpable mass 4.2cm in the upper inner left breast at the 11 o'clock position with 4 enlarged axillary lymph nodes. Biopsy on 08/26/18 showed invasive mammary carcinoma, grade 1, HER-2 negative (1+), ER 100%, PR 0%, Ki67 15% in the left breast and axilla.   08/26/2018 Cancer Staging   Staging form: Breast, AJCC 8th Edition - Clinical stage from 08/26/2018: Stage IIB (cT2, cN1, cM0, G1, ER+, PR-, HER2-) - Signed by [name], NP on 09/03/2018   09/02/2018 -  Anti-estrogen oral therapy   Palliative anti-estrogen therapy with anastrozole      CHIEF COMPLIANT: Follow-up on Faslodex  INTERVAL HISTORY: April Gallagher is a 88 year old above-mentioned history of left breast cancer who is currently on Faslodex injections.  She progressed on anastrozole therapy.  She is tolerating Faslodex much better.  Does not have any complaints or concerns with taking the medication.  She complains of swelling of the legs especially the left leg.   ALLERGIES:  is allergic to other.  MEDICATIONS:  Current Outpatient Medications  Medication Sig Dispense Refill   ACCU-CHEK SMARTVIEW test strip      amLODipine (NORVASC) 10 MG tablet Take 10 mg by mouth daily.     atorvastatin (LIPITOR) 40 MG tablet Take 40 mg by mouth at bedtime.      brimonidine (ALPHAGAN) 0.2 % ophthalmic solution INSTILL 1 DROP INTO RIGHT EYE TWICE DAILY     calcium carbonate (OS-CAL) 600 MG TABS tablet Take 600 mg by mouth every morning.      clotrimazole-betamethasone (LOTRISONE) cream  clotrimazole-betamethasone 1 %-0.05 % topical cream     Coenzyme Q10 (CO Q 10 PO) Take 1 tablet by mouth daily.     diclofenac Sodium (VOLTAREN) 1 % GEL diclofenac 1 % topical gel     Flaxseed, Linseed, (FLAXSEED OIL PO) Take 1 tablet by mouth daily.      GARLIC PO Take 1 tablet by mouth daily.      glucosamine-chondroitin 500-400 MG tablet Take 1 tablet by mouth daily.     glucose blood test strip Accu-Chek SmartView Test Strips  CHECK BLOOD SUGAR TWICE  DAILY     ketorolac (TORADOL) 60 MG/2ML SOLN injection ketorolac 60 mg/2 mL intramuscular solution  Inject 60 mg IM x 1     latanoprost (XALATAN) 0.005 % ophthalmic solution Place 1 drop into the right eye at bedtime.      meclizine (ANTIVERT) 25 MG tablet meclizine 25 mg tablet     metFORMIN (GLUCOPHAGE) 500 MG tablet Take 500 mg by mouth 2 (two) times daily with a meal.      predniSONE (DELTASONE) 5 MG tablet prednisone 5 mg tablets in a dose pack  TAKE AS DIRECTED FOR 6 DAYS     No current facility-administered medications for this visit.    PHYSICAL EXAMINATION: ECOG PERFORMANCE STATUS: 1 - Symptomatic but completely ambulatory  Vitals:   12/13/21 1458  BP: (!) 166/71  Pulse: 78  Resp: 16  Temp: 97.9 F (36.6 C)  SpO2: 100%   Filed Weights   12/13/21 1458  Weight: 160 lb 6.4 oz (72.8  kg)      LABORATORY DATA:  I have reviewed the data as listed    Latest Ref Rng & Units 12/13/2021    2:46 PM 08/10/2021    2:43 PM 09/08/2018   12:53 PM  CMP  Glucose 70 - 99 mg/dL 124  90  89   BUN 8 - 23 mg/dL 14  18  10   Creatinine 0.44 - 1.00 mg/dL 0.77  0.69  0.75   Sodium 135 - 145 mmol/L 138  140  142   Potassium 3.5 - 5.1 mmol/L 4.2  3.9  3.9   Chloride 98 - 111 mmol/L 105  105  106   CO2 22 - 32 mmol/L 27  29  27   Calcium 8.9 - 10.3 mg/dL 9.6  10.1  10.0   Total Protein 6.5 - 8.1 g/dL 6.8  7.8  7.5   Total Bilirubin 0.3 - 1.2 mg/dL 0.6  0.5  0.5   Alkaline Phos 38 - 126 U/L 48  58  61   AST 15 - 41 U/L 14  13  12    ALT 0 - 44 U/L 11  8  9     Lab Results  Component Value Date   WBC 4.4 12/13/2021   HGB 10.1 (L) 12/13/2021   HCT 30.6 (L) 12/13/2021   MCV 95.0 12/13/2021   PLT 162 12/13/2021   NEUTROABS 2.5 12/13/2021    ASSESSMENT & PLAN:  Malignant neoplasm of upper-inner quadrant of left breast in female, estrogen receptor positive ([facility]) 08/26/2018:Palpable mass 4.2cm in the upper inner left breast at the 11 o'clock position with 4 enlarged axillary lymph nodes. Biopsy on 08/26/18 showed invasive lobular carcinoma, grade 1-2, HER-2 negative (1+), ER 100%, PR 0%, Ki67 15% in the left breast and axilla. T2N1 stage IIa   CT scans showed the breast cancer and the lymphadenopathy in the axilla and the subpectoral areas.  In addition to this she had a left kidney mass measuring 4.5 x 3.5 cm.  Could be a primary renal cell cancer.  But given her age and health issues no surgical approaches are planned.   Current treatment: Palliative treatment with anastrozole 1 mg daily discontinued 08/10/2021 switched to Faslodex August 2023   Breast cancer surveillance: 1. Breast exam: Left breast palpable mass measuring 3 to 4 cm in size 2. left breast mammogram and ultrasound 07/31/2021: Enlarging mass 4 cm enlarging numerous left axillary lymph nodes at least 6 12/14/2021: Leg swelling: Lower extremity ultrasound: Negative  Mammogram and ultrasound be planned to evaluate response to Faslodex. Return to clinic monthly for Faslodex injections    Orders Placed This Encounter  Procedures   US BREAST LTD UNI LEFT INC AXILLA    Standing Status:   Future    Standing Expiration Date:   12/14/2022    Order Specific Question:   Reason for Exam (SYMPTOM  OR DIAGNOSIS REQUIRED)    Answer:   Breast cancer reassessment    Order Specific Question:   Preferred imaging location?    Answer[facility]    Order Specific Question:   Release to patient    Answer:   Immediate   MM DIAG BREAST TOMO UNI LEFT     Standing Status:   Future    Standing Expiration Date:   12/14/2022    Order Specific Question:   Reason for Exam (SYMPTOM  OR DIAGNOSIS REQUIRED)    Answer:   breast cancer reassessment      Order Specific Question:   Preferred imaging location?    Answer:   [facility]    Order Specific Question:   Release to patient    Answer:   Immediate   The patient has a good understanding of the overall plan. she agrees with it. she will call with any problems that may develop before the next visit here. Total time spent: 30 mins including face to face time and time spent for planning, charting and co-ordination of care   Viinay K Gudena, MD 12/14/21    I Deritra, [place] am scribing for Dr. Gudena  I have reviewed the above documentation for accuracy and completeness, and I agree with the above.   

## 2021-12-12 ENCOUNTER — Other Ambulatory Visit: Payer: Self-pay | Admitting: *Deleted

## 2021-12-12 DIAGNOSIS — Z170 Estrogen receptor positive status [ER+]: Secondary | ICD-10-CM

## 2021-12-13 ENCOUNTER — Other Ambulatory Visit: Payer: Self-pay

## 2021-12-13 ENCOUNTER — Telehealth: Payer: Self-pay | Admitting: *Deleted

## 2021-12-13 ENCOUNTER — Inpatient Hospital Stay: Payer: Medicare Other | Admitting: Hematology and Oncology

## 2021-12-13 ENCOUNTER — Inpatient Hospital Stay: Payer: Medicare Other | Attending: Hematology and Oncology

## 2021-12-13 ENCOUNTER — Inpatient Hospital Stay: Payer: Medicare Other

## 2021-12-13 ENCOUNTER — Ambulatory Visit (HOSPITAL_BASED_OUTPATIENT_CLINIC_OR_DEPARTMENT_OTHER)
Admission: RE | Admit: 2021-12-13 | Discharge: 2021-12-13 | Disposition: A | Discharge status: Home / Self Care | Payer: Medicare Other | Source: Ambulatory Visit | Attending: Internal Medicine | Admitting: Internal Medicine

## 2021-12-13 VITALS — BP 166/71 | HR 78 | Temp 97.9°F | Resp 16 | Ht 62.0 in | Wt 160.4 lb

## 2021-12-13 DIAGNOSIS — C50212 Malignant neoplasm of upper-inner quadrant of left female breast: Secondary | ICD-10-CM

## 2021-12-13 DIAGNOSIS — Z170 Estrogen receptor positive status [ER+]: Secondary | ICD-10-CM | POA: Insufficient documentation

## 2021-12-13 DIAGNOSIS — Z5111 Encounter for antineoplastic chemotherapy: Secondary | ICD-10-CM | POA: Diagnosis present

## 2021-12-13 DIAGNOSIS — R2241 Localized swelling, mass and lump, right lower limb: Secondary | ICD-10-CM | POA: Diagnosis not present

## 2021-12-13 LAB — CBC WITH DIFFERENTIAL (CANCER CENTER ONLY)
Abs Immature Granulocytes: 0.04 10*3/uL (ref 0.00–0.07)
Basophils Absolute: 0 10*3/uL (ref 0.0–0.1)
Basophils Relative: 1 %
Eosinophils Absolute: 0.2 10*3/uL (ref 0.0–0.5)
Eosinophils Relative: 3 %
HCT: 30.6 % — ABNORMAL LOW (ref 36.0–46.0)
Hemoglobin: 10.1 g/dL — ABNORMAL LOW (ref 12.0–15.0)
Immature Granulocytes: 1 %
Lymphocytes Relative: 31 %
Lymphs Abs: 1.4 10*3/uL (ref 0.7–4.0)
MCH: 31.4 pg (ref 26.0–34.0)
MCHC: 33 g/dL (ref 30.0–36.0)
MCV: 95 fL (ref 80.0–100.0)
Monocytes Absolute: 0.4 10*3/uL (ref 0.1–1.0)
Monocytes Relative: 9 %
Neutro Abs: 2.5 10*3/uL (ref 1.7–7.7)
Neutrophils Relative %: 55 %
Platelet Count: 162 10*3/uL (ref 150–400)
RBC: 3.22 MIL/uL — ABNORMAL LOW (ref 3.87–5.11)
RDW: 12.7 % (ref 11.5–15.5)
WBC Count: 4.4 10*3/uL (ref 4.0–10.5)
nRBC: 0 % (ref 0.0–0.2)

## 2021-12-13 LAB — CMP (CANCER CENTER ONLY)
ALT: 11 U/L (ref 0–44)
AST: 14 U/L — ABNORMAL LOW (ref 15–41)
Albumin: 3.7 g/dL (ref 3.5–5.0)
Alkaline Phosphatase: 48 U/L (ref 38–126)
Anion gap: 6 (ref 5–15)
BUN: 14 mg/dL (ref 8–23)
CO2: 27 mmol/L (ref 22–32)
Calcium: 9.6 mg/dL (ref 8.9–10.3)
Chloride: 105 mmol/L (ref 98–111)
Creatinine: 0.77 mg/dL (ref 0.44–1.00)
GFR, Estimated: >60 mL/min (ref >60)
Glucose, Bld: 124 mg/dL — ABNORMAL HIGH (ref 70–99)
Potassium: 4.2 mmol/L (ref 3.5–5.1)
Sodium: 138 mmol/L (ref 135–145)
Total Bilirubin: 0.6 mg/dL (ref 0.3–1.2)
Total Protein: 6.8 g/dL (ref 6.5–8.1)

## 2021-12-13 MED ORDER — FULVESTRANT 250 MG/5ML IM SOSY
500.0000 mg | PREFILLED_SYRINGE | Freq: Once | INTRAMUSCULAR | Status: AC
  Administered 2021-12-13: 500 mg via INTRAMUSCULAR

## 2021-12-13 NOTE — Telephone Encounter (Signed)
Contacted by Earnstine Regal, Tech with Vascular Ultrasound. Stat Lower Extremity US completed.  Initial results - no evidence of DVT right or left.  Message routed to Dr. Lindi Adie and notification left on his desk for review in AM

## 2021-12-13 NOTE — Progress Notes (Signed)
Right lower extremity venous duplex has been completed. Preliminary results can be found in CV Proc through chart review.  Results were given to [facility] at Dr. Geralyn Flash office.  12/13/21 3:40 PM April Gallagher RVT

## 2021-12-13 NOTE — Assessment & Plan Note (Signed)
08/26/2018:Palpable mass 4.2cm in the upper inner left breast at the 11 o'clock position with 4 enlarged axillary lymph nodes. Biopsy on 08/26/18 showed invasive lobular carcinoma, grade 1-2, HER-2 negative (1+), ER 100%, PR 0%, Ki67 15% in the left breast and axilla. T2N1 stage IIa   CT scans showed the breast cancer and the lymphadenopathy in the axilla and the subpectoral areas.  In addition to this she had a left kidney mass measuring 4.5 x 3.5 cm.  Could be a primary renal cell cancer.  But given her age and health issues no surgical approaches are planned.   Current treatment: Palliative treatment with anastrozole 1 mg daily discontinued 08/10/2021 switched to Faslodex August 2023   Breast cancer surveillance: 1. Breast exam: Left breast palpable mass measuring 3 to 4 cm in size 2. left breast mammogram and ultrasound 07/31/2021: Enlarging mass 4 cm enlarging numerous left axillary lymph nodes at least 6   Return to clinic monthly for Faslodex injections 

## 2021-12-14 ENCOUNTER — Encounter: Payer: Self-pay | Admitting: Hematology and Oncology

## 2022-01-09 ENCOUNTER — Other Ambulatory Visit: Payer: Self-pay

## 2022-01-09 ENCOUNTER — Inpatient Hospital Stay: Payer: Medicare Other | Attending: Hematology and Oncology

## 2022-01-09 VITALS — BP 135/67 | HR 87 | Temp 98.6°F | Resp 20

## 2022-01-09 DIAGNOSIS — Z170 Estrogen receptor positive status [ER+]: Secondary | ICD-10-CM | POA: Diagnosis not present

## 2022-01-09 DIAGNOSIS — Z5111 Encounter for antineoplastic chemotherapy: Secondary | ICD-10-CM | POA: Diagnosis present

## 2022-01-09 DIAGNOSIS — C50212 Malignant neoplasm of upper-inner quadrant of left female breast: Secondary | ICD-10-CM | POA: Insufficient documentation

## 2022-01-09 MED ORDER — FULVESTRANT 250 MG/5ML IM SOSY
500.0000 mg | PREFILLED_SYRINGE | Freq: Once | INTRAMUSCULAR | Status: AC
  Administered 2022-01-09: 500 mg via INTRAMUSCULAR
  Filled 2022-01-09: qty 10

## 2022-01-09 NOTE — Patient Instructions (Signed)

## 2022-01-30 ENCOUNTER — Ambulatory Visit: Payer: Medicare Other | Admitting: Podiatry

## 2022-01-30 ENCOUNTER — Encounter: Payer: Self-pay | Admitting: Podiatry

## 2022-01-30 VITALS — BP 120/67 | HR 79

## 2022-01-30 DIAGNOSIS — M79674 Pain in right toe(s): Secondary | ICD-10-CM

## 2022-01-30 DIAGNOSIS — B351 Tinea unguium: Secondary | ICD-10-CM | POA: Diagnosis not present

## 2022-01-30 DIAGNOSIS — M79675 Pain in left toe(s): Secondary | ICD-10-CM | POA: Diagnosis not present

## 2022-01-30 DIAGNOSIS — Q828 Other specified congenital malformations of skin: Secondary | ICD-10-CM

## 2022-01-30 DIAGNOSIS — E1159 Type 2 diabetes mellitus with other circulatory complications: Secondary | ICD-10-CM

## 2022-01-30 NOTE — Progress Notes (Addendum)
This patient returns to my office for at risk foot care.  This patient requires this care by a professional since this patient will be at risk due to having type 2 diabetes with angiopathy.     This patient is unable to cut nails herself since the patient cannot reach her nails.These nails are painful walking and wearing shoes. Patient  has painful callus both feet. She presents to the office with her daughter.  She requests diabetic shoes. This patient presents for at risk foot care today.  General Appearance  Alert, conversant and in no acute stress.  Vascular  Dorsalis pedis and posterior tibial  pulses are weakly  palpable  bilaterally.  Capillary return is within normal limits  bilaterally. Temperature is within normal limits  bilaterally.  Neurologic  Senn-Weinstein monofilament wire test within normal limits  bilaterally. Muscle power within normal limits bilaterally.  Nails Thick disfigured discolored nails with subungual debris  from hallux to fifth toes bilaterally. No evidence of bacterial infection or drainage bilaterally.  Orthopedic  No limitations of motion  feet .  No crepitus or effusions noted.  No bony pathology or digital deformities noted.  Skin  normotropic skin with no porokeratosis noted bilaterally.  No signs of infections or ulcers noted.   Porokeratosis sub 1 left and sub 2 right.   Onychomycosis  Pain in right toes  Pain in left toes  Porokeratosis  B/L.  Consent was obtained for treatment procedures.   Mechanical debridement of nails 1-5  bilaterally performed with a nail nipper.  Filed with dremel without incident. Debride porokeratosis with dremel tool.    Return office visit  4    months                  Told patient to return for periodic foot care and evaluation due to potential at risk complications.   Gardiner Barefoot DPM

## 2022-02-06 ENCOUNTER — Inpatient Hospital Stay: Payer: Medicare Other | Attending: Hematology and Oncology

## 2022-02-06 ENCOUNTER — Other Ambulatory Visit: Payer: Self-pay

## 2022-02-06 VITALS — BP 138/67 | HR 74 | Temp 98.9°F | Resp 18

## 2022-02-06 DIAGNOSIS — Z5111 Encounter for antineoplastic chemotherapy: Secondary | ICD-10-CM | POA: Diagnosis present

## 2022-02-06 DIAGNOSIS — C50212 Malignant neoplasm of upper-inner quadrant of left female breast: Secondary | ICD-10-CM | POA: Diagnosis present

## 2022-02-06 DIAGNOSIS — Z170 Estrogen receptor positive status [ER+]: Secondary | ICD-10-CM | POA: Insufficient documentation

## 2022-02-06 MED ORDER — FULVESTRANT 250 MG/5ML IM SOSY
500.0000 mg | PREFILLED_SYRINGE | Freq: Once | INTRAMUSCULAR | Status: AC
  Administered 2022-02-06: 500 mg via INTRAMUSCULAR
  Filled 2022-02-06: qty 10

## 2022-02-06 NOTE — Patient Instructions (Signed)

## 2022-02-12 ENCOUNTER — Ambulatory Visit
Admission: RE | Admit: 2022-02-12 | Discharge: 2022-02-12 | Disposition: A | Discharge status: Home / Self Care | Payer: Medicare Other | Source: Ambulatory Visit | Attending: Hematology and Oncology | Admitting: Hematology and Oncology

## 2022-02-12 DIAGNOSIS — C50212 Malignant neoplasm of upper-inner quadrant of left female breast: Secondary | ICD-10-CM

## 2022-03-05 NOTE — Progress Notes (Signed)
Patient Care Team: Willey Blade, MD as PCP - General (Internal Medicine)  DIAGNOSIS: No diagnosis found.  SUMMARY OF ONCOLOGIC HISTORY: Oncology History  Malignant neoplasm of upper-inner quadrant of left breast in female, estrogen receptor positive ([place])  08/26/2018 Initial Diagnosis   Palpable mass 4.2cm in the upper inner left breast at the 11 o'clock position with 4 enlarged axillary lymph nodes. Biopsy on 08/26/18 showed invasive mammary carcinoma, grade 1, HER-2 negative (1+), ER 100%, PR 0%, Ki67 15% in the left breast and axilla.   08/26/2018 Cancer Staging   Staging form: Breast, AJCC 8th Edition - Clinical stage from 08/26/2018: Stage IIB (cT2, cN1, cM0, G1, ER+, PR-, HER2-) - Signed by [name], NP on 09/03/2018   09/02/2018 -  Anti-estrogen oral therapy   Palliative anti-estrogen therapy with anastrozole      CHIEF COMPLIANT:   INTERVAL HISTORY: April Gallagher is a   ALLERGIES:  is allergic to other.  MEDICATIONS:  Current Outpatient Medications  Medication Sig Dispense Refill   ACCU-CHEK SMARTVIEW test strip      amLODipine (NORVASC) 10 MG tablet Take 10 mg by mouth daily.     atorvastatin (LIPITOR) 40 MG tablet Take 40 mg by mouth at bedtime.      brimonidine (ALPHAGAN) 0.2 % ophthalmic solution INSTILL 1 DROP INTO RIGHT EYE TWICE DAILY     calcium carbonate (OS-CAL) 600 MG TABS tablet Take 600 mg by mouth every morning.      clotrimazole-betamethasone (LOTRISONE) cream clotrimazole-betamethasone 1 %-0.05 % topical cream     Coenzyme Q10 (CO Q 10 PO) Take 1 tablet by mouth daily.     diclofenac Sodium (VOLTAREN) 1 % GEL diclofenac 1 % topical gel     Flaxseed, Linseed, (FLAXSEED OIL PO) Take 1 tablet by mouth daily.      GARLIC PO Take 1 tablet by mouth daily.      glucosamine-chondroitin 500-400 MG tablet Take 1 tablet by mouth daily.     glucose blood test strip Accu-Chek SmartView Test Strips  CHECK BLOOD SUGAR TWICE  DAILY     ketorolac  (TORADOL) 60 MG/2ML SOLN injection ketorolac 60 mg/2 mL intramuscular solution  Inject 60 mg IM x 1     latanoprost (XALATAN) 0.005 % ophthalmic solution Place 1 drop into the right eye at bedtime.      meclizine (ANTIVERT) 25 MG tablet meclizine 25 mg tablet     metFORMIN (GLUCOPHAGE) 500 MG tablet Take 500 mg by mouth 2 (two) times daily with a meal.      predniSONE (DELTASONE) 5 MG tablet prednisone 5 mg tablets in a dose pack  TAKE AS DIRECTED FOR 6 DAYS     No current facility-administered medications for this visit.    PHYSICAL EXAMINATION: ECOG PERFORMANCE STATUS: {CHL ONC ECOG PS:[id number]}  There were no vitals filed for this visit. There were no vitals filed for this visit.  BREAST:*** No palpable masses or nodules in either right or left breasts. No palpable axillary supraclavicular or infraclavicular adenopathy no breast tenderness or nipple discharge. (exam performed in the presence of a chaperone)  LABORATORY DATA:  I have reviewed the data as listed    Latest Ref Rng & Units 12/13/2021    2:46 PM 08/10/2021    2:43 PM 09/08/2018   12:53 PM  CMP  Glucose 70 - 99 mg/dL 124  90  89   BUN 8 - 23 mg/dL 14  18  10   $ Creatinine 0.44 -  1.00 mg/dL 0.77  0.69  0.75   Sodium 135 - 145 mmol/L 138  140  142   Potassium 3.5 - 5.1 mmol/L 4.2  3.9  3.9   Chloride 98 - 111 mmol/L 105  105  106   CO2 22 - 32 mmol/L 27  29  27   $ Calcium 8.9 - 10.3 mg/dL 9.6  10.1  10.0   Total Protein 6.5 - 8.1 g/dL 6.8  7.8  7.5   Total Bilirubin 0.3 - 1.2 mg/dL 0.6  0.5  0.5   Alkaline Phos 38 - 126 U/L 48  58  61   AST 15 - 41 U/L 14  13  12   $ ALT 0 - 44 U/L 11  8  9     $ Lab Results  Component Value Date   WBC 4.4 12/13/2021   HGB 10.1 (L) 12/13/2021   HCT 30.6 (L) 12/13/2021   MCV 95.0 12/13/2021   PLT 162 12/13/2021   NEUTROABS 2.5 12/13/2021    ASSESSMENT & PLAN:  No problem-specific Assessment & Plan notes found for this encounter.    No orders of the defined types were  placed in this encounter.  The patient has a good understanding of the overall plan. she agrees with it. she will call with any problems that may develop before the next visit here. Total time spent: 30 mins including face to face time and time spent for planning, charting and co-ordination of care   Suzzette Righter, [place] 03/05/22    I Gardiner Coins am acting as a Education administrator for [employer]  ***

## 2022-03-06 ENCOUNTER — Inpatient Hospital Stay: Payer: Medicare Other

## 2022-03-06 ENCOUNTER — Other Ambulatory Visit: Payer: Self-pay

## 2022-03-06 ENCOUNTER — Inpatient Hospital Stay: Payer: Medicare Other | Attending: Hematology and Oncology | Admitting: Hematology and Oncology

## 2022-03-06 VITALS — BP 136/69 | HR 64 | Temp 97.3°F | Resp 18 | Wt 160.1 lb

## 2022-03-06 DIAGNOSIS — Z79818 Long term (current) use of other agents affecting estrogen receptors and estrogen levels: Secondary | ICD-10-CM | POA: Insufficient documentation

## 2022-03-06 DIAGNOSIS — C50212 Malignant neoplasm of upper-inner quadrant of left female breast: Secondary | ICD-10-CM

## 2022-03-06 DIAGNOSIS — Z5111 Encounter for antineoplastic chemotherapy: Secondary | ICD-10-CM | POA: Insufficient documentation

## 2022-03-06 DIAGNOSIS — Z170 Estrogen receptor positive status [ER+]: Secondary | ICD-10-CM | POA: Diagnosis not present

## 2022-03-06 MED ORDER — FULVESTRANT 250 MG/5ML IM SOSY
500.0000 mg | PREFILLED_SYRINGE | Freq: Once | INTRAMUSCULAR | Status: AC
  Administered 2022-03-06: 500 mg via INTRAMUSCULAR
  Filled 2022-03-06: qty 10

## 2022-03-06 NOTE — Assessment & Plan Note (Signed)
08/26/2018:Palpable mass 4.2cm in the upper inner left breast at the 11 o'clock position with 4 enlarged axillary lymph nodes. Biopsy on 08/26/18 showed invasive lobular carcinoma, grade 1-2, HER-2 negative (1+), ER 100%, PR 0%, Ki67 15% in the left breast and axilla. T2N1 stage IIa   CT scans showed the breast cancer and the lymphadenopathy in the axilla and the subpectoral areas.  In addition to this she had a left kidney mass measuring 4.5 x 3.5 cm.  Could be a primary renal cell cancer.  But given her age and health issues no surgical approaches are planned.   Current treatment: Palliative treatment with anastrozole 1 mg daily discontinued 08/10/2021 switched to Faslodex August 2023   Breast cancer surveillance: 1. Breast exam: Left breast palpable mass measuring 3 to 4 cm in size 2. left breast mammogram and ultrasound 07/31/2021: Enlarging mass 4 cm enlarging numerous left axillary lymph nodes at least 6 12/14/2021: Leg swelling: Lower extremity ultrasound: Negative   02/12/2022: Mammogram and ultrasound: Unchanged left breast mass 3.9 cm multiple abnormal lymph nodes left axilla at least 5 in number  Return to clinic monthly for Faslodex injections

## 2022-03-06 NOTE — Patient Instructions (Signed)

## 2022-04-03 ENCOUNTER — Other Ambulatory Visit: Payer: Self-pay

## 2022-04-03 ENCOUNTER — Other Ambulatory Visit: Payer: Self-pay | Admitting: Hematology and Oncology

## 2022-04-03 ENCOUNTER — Inpatient Hospital Stay: Payer: Medicare Other | Attending: Hematology and Oncology

## 2022-04-03 VITALS — BP 140/65 | HR 70 | Temp 98.9°F | Resp 18

## 2022-04-03 DIAGNOSIS — Z170 Estrogen receptor positive status [ER+]: Secondary | ICD-10-CM

## 2022-04-03 DIAGNOSIS — Z5111 Encounter for antineoplastic chemotherapy: Secondary | ICD-10-CM | POA: Diagnosis present

## 2022-04-03 DIAGNOSIS — C50212 Malignant neoplasm of upper-inner quadrant of left female breast: Secondary | ICD-10-CM | POA: Insufficient documentation

## 2022-04-03 DIAGNOSIS — Z79811 Long term (current) use of aromatase inhibitors: Secondary | ICD-10-CM | POA: Diagnosis not present

## 2022-04-03 MED ORDER — FULVESTRANT 250 MG/5ML IM SOSY
500.0000 mg | PREFILLED_SYRINGE | Freq: Once | INTRAMUSCULAR | Status: AC
  Administered 2022-04-03: 500 mg via INTRAMUSCULAR
  Filled 2022-04-03: qty 10

## 2022-05-01 ENCOUNTER — Inpatient Hospital Stay: Payer: Medicare Other | Attending: Hematology and Oncology

## 2022-05-01 ENCOUNTER — Other Ambulatory Visit: Payer: Self-pay

## 2022-05-01 VITALS — BP 152/62 | HR 84 | Temp 98.4°F | Resp 18

## 2022-05-01 DIAGNOSIS — Z79811 Long term (current) use of aromatase inhibitors: Secondary | ICD-10-CM | POA: Diagnosis not present

## 2022-05-01 DIAGNOSIS — Z170 Estrogen receptor positive status [ER+]: Secondary | ICD-10-CM | POA: Insufficient documentation

## 2022-05-01 DIAGNOSIS — C50212 Malignant neoplasm of upper-inner quadrant of left female breast: Secondary | ICD-10-CM | POA: Diagnosis present

## 2022-05-01 DIAGNOSIS — Z5111 Encounter for antineoplastic chemotherapy: Secondary | ICD-10-CM | POA: Insufficient documentation

## 2022-05-01 MED ORDER — FULVESTRANT 250 MG/5ML IM SOSY
500.0000 mg | PREFILLED_SYRINGE | Freq: Once | INTRAMUSCULAR | Status: AC
  Administered 2022-05-01: 500 mg via INTRAMUSCULAR
  Filled 2022-05-01: qty 10

## 2022-05-15 ENCOUNTER — Other Ambulatory Visit: Payer: Self-pay | Admitting: Hematology and Oncology

## 2022-05-15 ENCOUNTER — Ambulatory Visit
Admission: RE | Admit: 2022-05-15 | Discharge: 2022-05-15 | Disposition: A | Discharge status: Home / Self Care | Payer: Medicare Other | Source: Ambulatory Visit | Attending: Hematology and Oncology | Admitting: Hematology and Oncology

## 2022-05-15 DIAGNOSIS — C50212 Malignant neoplasm of upper-inner quadrant of left female breast: Secondary | ICD-10-CM

## 2022-05-15 DIAGNOSIS — Z170 Estrogen receptor positive status [ER+]: Secondary | ICD-10-CM

## 2022-05-23 ENCOUNTER — Telehealth: Payer: Self-pay | Admitting: Hematology and Oncology

## 2022-05-29 ENCOUNTER — Inpatient Hospital Stay: Payer: Medicare Other

## 2022-05-29 ENCOUNTER — Telehealth: Payer: Self-pay | Admitting: Hematology and Oncology

## 2022-06-01 ENCOUNTER — Other Ambulatory Visit: Payer: Self-pay

## 2022-06-01 ENCOUNTER — Inpatient Hospital Stay: Payer: Medicare Other | Attending: Hematology and Oncology

## 2022-06-01 VITALS — BP 149/72 | HR 84 | Temp 99.1°F | Resp 18

## 2022-06-01 DIAGNOSIS — C50212 Malignant neoplasm of upper-inner quadrant of left female breast: Secondary | ICD-10-CM | POA: Diagnosis present

## 2022-06-01 DIAGNOSIS — Z79811 Long term (current) use of aromatase inhibitors: Secondary | ICD-10-CM | POA: Diagnosis not present

## 2022-06-01 DIAGNOSIS — Z79818 Long term (current) use of other agents affecting estrogen receptors and estrogen levels: Secondary | ICD-10-CM | POA: Insufficient documentation

## 2022-06-01 DIAGNOSIS — Z170 Estrogen receptor positive status [ER+]: Secondary | ICD-10-CM | POA: Insufficient documentation

## 2022-06-01 DIAGNOSIS — Z5111 Encounter for antineoplastic chemotherapy: Secondary | ICD-10-CM | POA: Diagnosis present

## 2022-06-01 MED ORDER — FULVESTRANT 250 MG/5ML IM SOSY
500.0000 mg | PREFILLED_SYRINGE | Freq: Once | INTRAMUSCULAR | Status: AC
  Administered 2022-06-01: 500 mg via INTRAMUSCULAR
  Filled 2022-06-01: qty 10

## 2022-06-01 NOTE — Patient Instructions (Signed)

## 2022-06-04 ENCOUNTER — Encounter: Payer: Self-pay | Admitting: Podiatry

## 2022-06-04 ENCOUNTER — Ambulatory Visit (INDEPENDENT_AMBULATORY_CARE_PROVIDER_SITE_OTHER): Payer: Medicare Other | Admitting: Podiatry

## 2022-06-04 DIAGNOSIS — E1159 Type 2 diabetes mellitus with other circulatory complications: Secondary | ICD-10-CM

## 2022-06-04 DIAGNOSIS — B351 Tinea unguium: Secondary | ICD-10-CM

## 2022-06-04 DIAGNOSIS — Q828 Other specified congenital malformations of skin: Secondary | ICD-10-CM | POA: Diagnosis not present

## 2022-06-04 DIAGNOSIS — M79675 Pain in left toe(s): Secondary | ICD-10-CM

## 2022-06-04 DIAGNOSIS — M79674 Pain in right toe(s): Secondary | ICD-10-CM

## 2022-06-04 NOTE — Progress Notes (Addendum)
This patient returns to my office for at risk foot care.  This patient requires this care by a professional since this patient will be at risk due to having type 2 diabetes with angiopathy.     This patient is unable to cut nails herself since the patient cannot reach her nails.These nails are painful walking and wearing shoes. Patient  has painful callus both feet. She presents to the office with her daughter.  She requests diabetic shoes. This patient presents for at risk foot care today.  General Appearance  Alert, conversant and in no acute stress.  Vascular  Dorsalis pedis and posterior tibial  pulses are weakly  palpable  bilaterally.  Capillary return is within normal limits  bilaterally. Temperature is within normal limits  bilaterally.  Neurologic  Senn-Weinstein monofilament wire test within normal limits  bilaterally. Muscle power within normal limits bilaterally.  Nails Thick disfigured discolored nails with subungual debris  from hallux to fifth toes bilaterally. No evidence of bacterial infection or drainage bilaterally.  Orthopedic  No limitations of motion  feet .  No crepitus or effusions noted.  No bony pathology or digital deformities noted.  Skin  normotropic skin with no porokeratosis noted bilaterally.  No signs of infections or ulcers noted.   Porokeratosis sub 1 left and sub 2 right.   Onychomycosis  Pain in right toes  Pain in left toes  Porokeratosis  B/L.  Consent was obtained for treatment procedures.   Mechanical debridement of nails 1-5  bilaterally performed with a nail nipper.  Filed with dremel without incident. Debride porokeratosis with dremel tool and # 15 blade.   Return office visit  4    months                  Told patient to return for periodic foot care and evaluation due to potential at risk complications.   Helane Gunther DPM

## 2022-06-12 DIAGNOSIS — H903 Sensorineural hearing loss, bilateral: Secondary | ICD-10-CM | POA: Insufficient documentation

## 2022-07-15 NOTE — Progress Notes (Signed)
Patient Care Team: Serena Croissant, MD as PCP - General (Hematology and Oncology)  DIAGNOSIS:  Encounter Diagnosis  Name Primary?   Malignant neoplasm of upper-inner quadrant of left breast in female, estrogen receptor positive ([facility]) Yes    SUMMARY OF ONCOLOGIC HISTORY: Oncology History  Malignant neoplasm of upper-inner quadrant of left breast in female, estrogen receptor positive ([facility])  08/26/2018 Initial Diagnosis   Palpable mass 4.2cm in the upper inner left breast at the 11 o'clock position with 4 enlarged axillary lymph nodes. Biopsy on 08/26/18 showed invasive mammary carcinoma, grade 1, HER-2 negative (1+), ER 100%, PR 0%, Ki67 15% in the left breast and axilla.   08/26/2018 Cancer Staging   Staging form: Breast, AJCC 8th Edition - Clinical stage from 08/26/2018: Stage IIB (cT2, cN1, cM0, G1, ER+, PR-, HER2-) - Signed by Loa Socks, NP on 09/03/2018   09/02/2018 -  Anti-estrogen oral therapy   Palliative anti-estrogen therapy with anastrozole      CHIEF COMPLIANT: Follow-up on Faslodex   INTERVAL HISTORY: April Gallagher is a 89 year old above-mentioned history of left breast cancer who is currently on Faslodex injections. She presents to the clinic for a follow-up. Pt reports she has been doing well. 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 She is tolerating the injections fairly well.  ALLERGIES:  is allergic to other.  MEDICATIONS:  Current Outpatient Medications  Medication Sig Dispense Refill   ACCU-CHEK SMARTVIEW test strip      amLODipine (NORVASC) 10 MG tablet Take 10 mg by mouth daily.     atorvastatin (LIPITOR) 40 MG tablet Take 40 mg by mouth at bedtime.      brimonidine (ALPHAGAN) 0.2 % ophthalmic solution INSTILL 1 DROP INTO RIGHT EYE TWICE DAILY     calcium carbonate (OS-CAL) 600 MG TABS tablet Take 600 mg by mouth every morning.      clotrimazole-betamethasone (LOTRISONE) cream clotrimazole-betamethasone 1 %-0.05 % topical cream     Coenzyme Q10  (CO Q 10 PO) Take 1 tablet by mouth daily.     diclofenac Sodium (VOLTAREN) 1 % GEL diclofenac 1 % topical gel     Flaxseed, Linseed, (FLAXSEED OIL PO) Take 1 tablet by mouth daily.      GARLIC PO Take 1 tablet by mouth daily.      glucosamine-chondroitin 500-400 MG tablet Take 1 tablet by mouth daily.     glucose blood test strip Accu-Chek SmartView Test Strips  CHECK BLOOD SUGAR TWICE  DAILY     ketorolac (TORADOL) 60 MG/2ML SOLN injection ketorolac 60 mg/2 mL intramuscular solution  Inject 60 mg IM x 1     latanoprost (XALATAN) 0.005 % ophthalmic solution Place 1 drop into the right eye at bedtime.      meclizine (ANTIVERT) 25 MG tablet meclizine 25 mg tablet     metFORMIN (GLUCOPHAGE) 500 MG tablet Take 500 mg by mouth 2 (two) times daily with a meal.      predniSONE (DELTASONE) 5 MG tablet prednisone 5 mg tablets in a dose pack  TAKE AS DIRECTED FOR 6 DAYS     No current facility-administered medications for this visit.    PHYSICAL EXAMINATION: ECOG PERFORMANCE STATUS: 1 - Symptomatic but completely ambulatory  Vitals:   07/23/22 1521  BP: (!) 166/68  Pulse: 87  Resp: 18  Temp: (!) 97.2 F (36.2 C)  SpO2: 97%   Filed Weights   07/23/22 1521  Weight: 159 lb 6.4 oz (72.3 kg)  LABORATORY DATA:  I have reviewed the data as listed    Latest Ref Rng & Units 12/13/2021    2:46 PM 08/10/2021    2:43 PM 09/08/2018   12:53 PM  CMP  Glucose 70 - 99 mg/dL 098  90  89   BUN 8 - 23 mg/dL 14  18  10    Creatinine 0.44 - 1.00 mg/dL 1.19  1.47  8.29   Sodium 135 - 145 mmol/L 138  140  142   Potassium 3.5 - 5.1 mmol/L 4.2  3.9  3.9   Chloride 98 - 111 mmol/L 105  105  106   CO2 22 - 32 mmol/L 27  29  27    Calcium 8.9 - 10.3 mg/dL 9.6  56.2  13.0   Total Protein 6.5 - 8.1 g/dL 6.8  7.8  7.5   Total Bilirubin 0.3 - 1.2 mg/dL 0.6  0.5  0.5   Alkaline Phos 38 - 126 U/L 48  58  61   AST 15 - 41 U/L 14  13  12    ALT 0 - 44 U/L 11  8  9      Lab Results  Component Value Date    WBC 4.4 12/13/2021   HGB 10.1 (L) 12/13/2021   HCT 30.6 (L) 12/13/2021   MCV 95.0 12/13/2021   PLT 162 12/13/2021   NEUTROABS 2.5 12/13/2021    ASSESSMENT & PLAN:  Malignant neoplasm of upper-inner quadrant of left breast in female, estrogen receptor positive ([facility]) 08/26/2018:Palpable mass 4.2cm in the upper inner left breast at the 11 o'clock position with 4 enlarged axillary lymph nodes. Biopsy on 08/26/18 showed invasive lobular carcinoma, grade 1-2, HER-2 negative (1+), ER 100%, PR 0%, Ki67 15% in the left breast and axilla. T2N1 stage IIa   CT scans showed the breast cancer and the lymphadenopathy in the axilla and the subpectoral areas.  In addition to this she had a left kidney mass measuring 4.5 x 3.5 cm.  Could be a primary renal cell cancer.  But given her age and health issues no surgical approaches are planned.   Current treatment: Palliative treatment with anastrozole 1 mg daily discontinued 08/10/2021 switched to Faslodex August 2023   Breast cancer surveillance: 1. left breast mammogram and ultrasound 05/15/2022: 3.6 cm left breast mass with left axillary lymph nodes (stable disease)  Return to clinic monthly for Faslodex injections Will continue with the monthly Faslodex injections and we will set up for a mammogram and ultrasound in 3 months and follow-up after that to discuss results.   Orders Placed This Encounter  Procedures   [place] LIMITED ULTRASOUND INCLUDING AXILLA LEFT BREAST     Standing Status:   Future    Standing Expiration Date:   07/23/2023    Order Specific Question:   Reason for Exam (SYMPTOM  OR DIAGNOSIS REQUIRED)    Answer:   surveillance    Order Specific Question:   Preferred imaging location?    Answer:   [facility]   MM DIAG BREAST TOMO UNI LEFT    Standing Status:   Future    Standing Expiration Date:   07/23/2023    Order Specific Question:   Reason for Exam (SYMPTOM  OR DIAGNOSIS REQUIRED)    Answer:   surveillance    Order Specific  Question:   Preferred imaging location?    Answer:   [facility]   The patient has a good understanding of the overall plan. she agrees with it. she  will call with any problems that may develop before the next visit here. Total time spent: 30 mins including face to face time and time spent for planning, charting and co-ordination of care   Tamsen Meek, MD 07/23/22    I Janan Ridge am acting as a Neurosurgeon for [employer]  I have reviewed the above documentation for accuracy and completeness, and I agree with the above.

## 2022-07-23 ENCOUNTER — Inpatient Hospital Stay: Payer: Medicare Other

## 2022-07-23 ENCOUNTER — Other Ambulatory Visit: Payer: Self-pay

## 2022-07-23 ENCOUNTER — Inpatient Hospital Stay: Payer: Medicare Other | Attending: Hematology and Oncology | Admitting: Hematology and Oncology

## 2022-07-23 VITALS — BP 166/68 | HR 87 | Temp 97.2°F | Resp 18 | Ht 62.0 in | Wt 159.4 lb

## 2022-07-23 DIAGNOSIS — C50212 Malignant neoplasm of upper-inner quadrant of left female breast: Secondary | ICD-10-CM | POA: Diagnosis present

## 2022-07-23 DIAGNOSIS — Z170 Estrogen receptor positive status [ER+]: Secondary | ICD-10-CM

## 2022-07-23 DIAGNOSIS — Z5111 Encounter for antineoplastic chemotherapy: Secondary | ICD-10-CM | POA: Insufficient documentation

## 2022-07-23 DIAGNOSIS — Z79811 Long term (current) use of aromatase inhibitors: Secondary | ICD-10-CM | POA: Insufficient documentation

## 2022-07-23 MED ORDER — FULVESTRANT 250 MG/5ML IM SOSY
500.0000 mg | PREFILLED_SYRINGE | Freq: Once | INTRAMUSCULAR | Status: AC
  Administered 2022-07-23: 500 mg via INTRAMUSCULAR
  Filled 2022-07-23: qty 10

## 2022-07-23 NOTE — Assessment & Plan Note (Addendum)
08/26/2018:Palpable mass 4.2cm in the upper inner left breast at the 11 o'clock position with 4 enlarged axillary lymph nodes. Biopsy on 08/26/18 showed invasive lobular carcinoma, grade 1-2, HER-2 negative (1+), ER 100%, PR 0%, Ki67 15% in the left breast and axilla. T2N1 stage IIa   CT scans showed the breast cancer and the lymphadenopathy in the axilla and the subpectoral areas.  In addition to this she had a left kidney mass measuring 4.5 x 3.5 cm.  Could be a primary renal cell cancer.  But given her age and health issues no surgical approaches are planned.   Current treatment: Palliative treatment with anastrozole 1 mg daily discontinued 08/10/2021 switched to Faslodex August 2023   Breast cancer surveillance: 1. left breast mammogram and ultrasound 05/15/2022: 3.6 cm left breast mass with left axillary lymph nodes (stable disease)  Return to clinic monthly for Faslodex injections

## 2022-08-20 ENCOUNTER — Inpatient Hospital Stay: Payer: Medicare Other | Attending: Hematology and Oncology

## 2022-08-20 ENCOUNTER — Other Ambulatory Visit: Payer: Self-pay

## 2022-08-20 VITALS — BP 152/72 | HR 86 | Temp 99.0°F | Resp 16

## 2022-08-20 DIAGNOSIS — Z170 Estrogen receptor positive status [ER+]: Secondary | ICD-10-CM | POA: Diagnosis not present

## 2022-08-20 DIAGNOSIS — C50212 Malignant neoplasm of upper-inner quadrant of left female breast: Secondary | ICD-10-CM | POA: Insufficient documentation

## 2022-08-20 DIAGNOSIS — Z5111 Encounter for antineoplastic chemotherapy: Secondary | ICD-10-CM | POA: Insufficient documentation

## 2022-08-20 MED ORDER — FULVESTRANT 250 MG/5ML IM SOSY
500.0000 mg | PREFILLED_SYRINGE | Freq: Once | INTRAMUSCULAR | Status: AC
  Administered 2022-08-20: 500 mg via INTRAMUSCULAR
  Filled 2022-08-20: qty 10

## 2022-09-18 ENCOUNTER — Inpatient Hospital Stay: Payer: Medicare Other | Attending: Hematology and Oncology

## 2022-09-18 VITALS — BP 137/68 | HR 78 | Temp 98.6°F | Resp 18

## 2022-09-18 DIAGNOSIS — C50212 Malignant neoplasm of upper-inner quadrant of left female breast: Secondary | ICD-10-CM | POA: Diagnosis present

## 2022-09-18 DIAGNOSIS — Z5111 Encounter for antineoplastic chemotherapy: Secondary | ICD-10-CM | POA: Insufficient documentation

## 2022-09-18 DIAGNOSIS — Z170 Estrogen receptor positive status [ER+]: Secondary | ICD-10-CM | POA: Diagnosis not present

## 2022-09-18 MED ORDER — FULVESTRANT 250 MG/5ML IM SOSY
500.0000 mg | PREFILLED_SYRINGE | Freq: Once | INTRAMUSCULAR | Status: AC
  Administered 2022-09-18: 500 mg via INTRAMUSCULAR
  Filled 2022-09-18: qty 10

## 2022-09-18 NOTE — Patient Instructions (Signed)

## 2022-10-01 ENCOUNTER — Ambulatory Visit: Payer: Medicare Other | Admitting: Podiatry

## 2022-10-08 ENCOUNTER — Encounter: Payer: Self-pay | Admitting: Podiatry

## 2022-10-08 ENCOUNTER — Ambulatory Visit (INDEPENDENT_AMBULATORY_CARE_PROVIDER_SITE_OTHER): Payer: Medicare Other | Admitting: Podiatry

## 2022-10-08 DIAGNOSIS — M79675 Pain in left toe(s): Secondary | ICD-10-CM

## 2022-10-08 DIAGNOSIS — M79674 Pain in right toe(s): Secondary | ICD-10-CM

## 2022-10-08 DIAGNOSIS — B351 Tinea unguium: Secondary | ICD-10-CM | POA: Diagnosis not present

## 2022-10-08 DIAGNOSIS — E1159 Type 2 diabetes mellitus with other circulatory complications: Secondary | ICD-10-CM

## 2022-10-08 NOTE — Progress Notes (Signed)
This patient returns to my office for at risk foot care.  This patient requires this care by a professional since this patient will be at risk due to having type 2 diabetes with angiopathy.     This patient is unable to cut nails herself since the patient cannot reach her nails.These nails are painful walking and wearing shoes.  She presents to the office with her daughter.   This patient presents for at risk foot care today.  General Appearance  Alert, conversant and in no acute stress.  Vascular  Dorsalis pedis and posterior tibial  pulses are weakly  palpable  bilaterally.  Capillary return is within normal limits  bilaterally. Temperature is within normal limits  bilaterally.  Neurologic  Senn-Weinstein monofilament wire test within normal limits  bilaterally. Muscle power within normal limits bilaterally.  Nails Thick disfigured discolored nails with subungual debris  from hallux to fifth toes bilaterally. No evidence of bacterial infection or drainage bilaterally.  Orthopedic  No limitations of motion  feet .  No crepitus or effusions noted.  No bony pathology or digital deformities noted.  Skin  normotropic skin with no porokeratosis noted bilaterally.  No signs of infections or ulcers noted.    Onychomycosis  Pain in right toes  Pain in left toes    Consent was obtained for treatment procedures.   Mechanical debridement of nails 1-5  bilaterally performed with a nail nipper.  Filed with dremel without incident.    Return office visit  4    months                  Told patient to return for periodic foot care and evaluation due to potential at risk complications.   Helane Gunther DPM

## 2022-10-16 ENCOUNTER — Inpatient Hospital Stay: Payer: Medicare Other | Attending: Hematology and Oncology

## 2022-10-16 DIAGNOSIS — Z5111 Encounter for antineoplastic chemotherapy: Secondary | ICD-10-CM | POA: Insufficient documentation

## 2022-10-16 DIAGNOSIS — Z170 Estrogen receptor positive status [ER+]: Secondary | ICD-10-CM | POA: Insufficient documentation

## 2022-10-16 DIAGNOSIS — C50212 Malignant neoplasm of upper-inner quadrant of left female breast: Secondary | ICD-10-CM | POA: Diagnosis present

## 2022-10-16 MED ORDER — FULVESTRANT 250 MG/5ML IM SOSY
500.0000 mg | PREFILLED_SYRINGE | Freq: Once | INTRAMUSCULAR | Status: AC
  Administered 2022-10-16: 500 mg via INTRAMUSCULAR
  Filled 2022-10-16: qty 10

## 2022-10-24 ENCOUNTER — Ambulatory Visit: Payer: Medicare Other

## 2022-10-24 DIAGNOSIS — M2141 Flat foot [pes planus] (acquired), right foot: Secondary | ICD-10-CM

## 2022-10-24 DIAGNOSIS — E119 Type 2 diabetes mellitus without complications: Secondary | ICD-10-CM

## 2022-10-24 DIAGNOSIS — E1159 Type 2 diabetes mellitus with other circulatory complications: Secondary | ICD-10-CM

## 2022-10-24 NOTE — Progress Notes (Signed)
Patient presents to the office today for diabetic shoe and insole measuring.  Patient was measured with brannock device to determine size and width for 1 pair of extra depth shoes and foam casted for 3 pair of insoles.   Documentation of medical necessity will be sent to patient's treating diabetic doctor to verify and sign.   Patient's diabetic provider: Andi Devon   Shoes and insoles will be ordered at that time and patient will be notified for an appointment for fitting when they arrive. Shoe size (per patient): 12 Brannock measurement: 11.5 Patient shoe selection- Shoe choice:   [id number] / A700W Shoe size ordered: 12W ABN and financials signed  Addison Bailey Cped, CFo, CFm

## 2022-11-15 ENCOUNTER — Inpatient Hospital Stay: Payer: Medicare Other

## 2022-11-15 ENCOUNTER — Ambulatory Visit
Admission: RE | Admit: 2022-11-15 | Discharge: 2022-11-15 | Disposition: A | Discharge status: Home / Self Care | Payer: Medicare Other | Source: Ambulatory Visit | Attending: Hematology and Oncology | Admitting: Hematology and Oncology

## 2022-11-15 ENCOUNTER — Inpatient Hospital Stay: Payer: Medicare Other | Admitting: Hematology and Oncology

## 2022-11-15 VITALS — BP 149/61 | HR 84 | Temp 97.9°F | Resp 18 | Ht 62.0 in | Wt 160.5 lb

## 2022-11-15 DIAGNOSIS — Z5111 Encounter for antineoplastic chemotherapy: Secondary | ICD-10-CM | POA: Diagnosis not present

## 2022-11-15 DIAGNOSIS — Z170 Estrogen receptor positive status [ER+]: Secondary | ICD-10-CM

## 2022-11-15 DIAGNOSIS — C50212 Malignant neoplasm of upper-inner quadrant of left female breast: Secondary | ICD-10-CM

## 2022-11-15 MED ORDER — FULVESTRANT 250 MG/5ML IM SOSY
500.0000 mg | PREFILLED_SYRINGE | Freq: Once | INTRAMUSCULAR | Status: AC
  Administered 2022-11-15: 500 mg via INTRAMUSCULAR
  Filled 2022-11-15: qty 10

## 2022-11-15 NOTE — Assessment & Plan Note (Signed)
08/26/2018:Palpable mass 4.2cm in the upper inner left breast at the 11 o'clock position with 4 enlarged axillary lymph nodes. Biopsy on 08/26/18 showed invasive lobular carcinoma, grade 1-2, HER-2 negative (1+), ER 100%, PR 0%, Ki67 15% in the left breast and axilla. T2N1 stage IIa   CT scans showed the breast cancer and the lymphadenopathy in the axilla and the subpectoral areas.  In addition to this she had a left kidney mass measuring 4.5 x 3.5 cm.  Could be a primary renal cell cancer.  But given her age and health issues no surgical approaches are planned.   Current treatment: Palliative treatment with anastrozole 1 mg daily discontinued 08/10/2021 switched to Faslodex August 2023   Breast cancer surveillance: 1. left breast mammogram and ultrasound 05/15/2022: 3.6 cm left breast mass with left axillary lymph nodes (stable disease)   Return to clinic monthly for Faslodex injections Will continue with the monthly Faslodex injections and we will set up for a mammogram and ultrasound in 3 months and follow-up after that to discuss results.

## 2022-11-15 NOTE — Patient Instructions (Signed)

## 2022-11-16 ENCOUNTER — Encounter: Payer: Self-pay | Admitting: Hematology and Oncology

## 2022-11-16 NOTE — Progress Notes (Signed)
Patient Care Team: Serena Croissant, MD as PCP - General (Hematology and Oncology)  DIAGNOSIS:  Encounter Diagnosis  Name Primary?   Malignant neoplasm of upper-inner quadrant of left breast in female, estrogen receptor positive (HCC) Yes    SUMMARY OF ONCOLOGIC HISTORY: Oncology History  Malignant neoplasm of upper-inner quadrant of left breast in female, estrogen receptor positive (HCC)  08/26/2018 Initial Diagnosis   Palpable mass 4.2cm in the upper inner left breast at the 11 o'clock position with 4 enlarged axillary lymph nodes. Biopsy on 08/26/18 showed invasive mammary carcinoma, grade 1, HER-2 negative (1+), ER 100%, PR 0%, Ki67 15% in the left breast and axilla.   08/26/2018 Cancer Staging   Staging form: Breast, AJCC 8th Edition - Clinical stage from 08/26/2018: Stage IIB (cT2, cN1, cM0, G1, ER+, PR-, HER2-) - Signed by Loa Socks, NP on 09/03/2018   09/02/2018 -  Anti-estrogen oral therapy   Palliative anti-estrogen therapy with anastrozole      CHIEF COMPLIANT: Follow-up on antiattrition therapy  HISTORY OF PRESENT ILLNESS:   History of Present Illness   The patient, with a history of cancer, is currently receiving regular injections for treatment. She reports no side effects from the treatment and seems to be responding well. The patient has also noticed an increase in urination, which may be due to increased water intake. The patient's daughter reports that the patient has lost weight recently and is also dealing with arthritis. The patient's hearing is impaired due to a broken hearing aid.         ALLERGIES:  is allergic to other.  MEDICATIONS:  Current Outpatient Medications  Medication Sig Dispense Refill   ACCU-CHEK SMARTVIEW test strip      amLODipine (NORVASC) 10 MG tablet Take 10 mg by mouth daily.     atorvastatin (LIPITOR) 40 MG tablet Take 40 mg by mouth at bedtime.      brimonidine (ALPHAGAN) 0.2 % ophthalmic solution INSTILL 1 DROP INTO  RIGHT EYE TWICE DAILY     calcium carbonate (OS-CAL) 600 MG TABS tablet Take 600 mg by mouth every morning.      clotrimazole-betamethasone (LOTRISONE) cream clotrimazole-betamethasone 1 %-0.05 % topical cream     Coenzyme Q10 (CO Q 10 PO) Take 1 tablet by mouth daily.     diclofenac Sodium (VOLTAREN) 1 % GEL diclofenac 1 % topical gel     Flaxseed, Linseed, (FLAXSEED OIL PO) Take 1 tablet by mouth daily.      GARLIC PO Take 1 tablet by mouth daily.      glucosamine-chondroitin 500-400 MG tablet Take 1 tablet by mouth daily.     glucose blood test strip Accu-Chek SmartView Test Strips  CHECK BLOOD SUGAR TWICE  DAILY     ketorolac (TORADOL) 60 MG/2ML SOLN injection ketorolac 60 mg/2 mL intramuscular solution  Inject 60 mg IM x 1     latanoprost (XALATAN) 0.005 % ophthalmic solution Place 1 drop into the right eye at bedtime.      meclizine (ANTIVERT) 25 MG tablet meclizine 25 mg tablet     metFORMIN (GLUCOPHAGE) 500 MG tablet Take 500 mg by mouth 2 (two) times daily with a meal.      predniSONE (DELTASONE) 5 MG tablet prednisone 5 mg tablets in a dose pack  TAKE AS DIRECTED FOR 6 DAYS     No current facility-administered medications for this visit.    PHYSICAL EXAMINATION: ECOG PERFORMANCE STATUS: 1 - Symptomatic but completely ambulatory  Vitals:  11/15/22 1431  BP: (!) 149/61  Pulse: 84  Resp: 18  Temp: 97.9 F (36.6 C)  SpO2: 98%   Filed Weights   11/15/22 1431  Weight: 160 lb 8 oz (72.8 kg)     LABORATORY DATA:  I have reviewed the data as listed    Latest Ref Rng & Units 12/13/2021    2:46 PM 08/10/2021    2:43 PM 09/08/2018   12:53 PM  CMP  Glucose 70 - 99 mg/dL 914  90  89   BUN 8 - 23 mg/dL 14  18  10    Creatinine 0.44 - 1.00 mg/dL 7.82  9.56  2.13   Sodium 135 - 145 mmol/L 138  140  142   Potassium 3.5 - 5.1 mmol/L 4.2  3.9  3.9   Chloride 98 - 111 mmol/L 105  105  106   CO2 22 - 32 mmol/L 27  29  27    Calcium 8.9 - 10.3 mg/dL 9.6  08.6  57.8   Total  Protein 6.5 - 8.1 g/dL 6.8  7.8  7.5   Total Bilirubin 0.3 - 1.2 mg/dL 0.6  0.5  0.5   Alkaline Phos 38 - 126 U/L 48  58  61   AST 15 - 41 U/L 14  13  12    ALT 0 - 44 U/L 11  8  9      Lab Results  Component Value Date   WBC 4.4 12/13/2021   HGB 10.1 (L) 12/13/2021   HCT 30.6 (L) 12/13/2021   MCV 95.0 12/13/2021   PLT 162 12/13/2021   NEUTROABS 2.5 12/13/2021    ASSESSMENT & PLAN:  Malignant neoplasm of upper-inner quadrant of left breast in female, estrogen receptor positive (HCC) 08/26/2018:Palpable mass 4.2cm in the upper inner left breast at the 11 o'clock position with 4 enlarged axillary lymph nodes. Biopsy on 08/26/18 showed invasive lobular carcinoma, grade 1-2, HER-2 negative (1+), ER 100%, PR 0%, Ki67 15% in the left breast and axilla. T2N1 stage IIa   CT scans showed the breast cancer and the lymphadenopathy in the axilla and the subpectoral areas.  In addition to this she had a left kidney mass measuring 4.5 x 3.5 cm.  Could be a primary renal cell cancer.  But given her age and health issues no surgical approaches are planned.   Current treatment: Palliative treatment with anastrozole 1 mg daily discontinued 08/10/2021 switched to Faslodex August 2023   Breast cancer surveillance: 1. left breast mammogram and ultrasound 05/15/2022: 3.6 cm left breast mass with left axillary lymph nodes (stable disease)   Increased Urination Patient reports increased frequency of urination, possibly due to increased water intake. -Advise moderation in water intake, especially after 6pm to reduce nocturia. -Consider evaluation for other causes of increased urination if symptoms persist.     Return to clinic monthly for Faslodex injections Will continue with the monthly Faslodex injections and we will set up for a mammogram and ultrasound in 3 months and follow-up after that to discuss results.     No orders of the defined types were placed in this encounter.  The patient has a good  understanding of the overall plan. she agrees with it. she will call with any problems that may develop before the next visit here. Total time spent: 30 mins including face to face time and time spent for planning, charting and co-ordination of care   Tamsen Meek, MD 11/16/22

## 2022-11-19 ENCOUNTER — Telehealth: Payer: Self-pay | Admitting: Hematology and Oncology

## 2022-11-19 NOTE — Telephone Encounter (Signed)
Left both patient and patient's daughter a messaeg regarding scheduled appointment times/dates; left callback if needed for rescheduling

## 2022-12-12 ENCOUNTER — Inpatient Hospital Stay: Payer: Medicare Other | Attending: Hematology and Oncology

## 2022-12-12 VITALS — BP 137/63 | HR 86 | Temp 99.2°F | Resp 16

## 2022-12-12 DIAGNOSIS — Z5111 Encounter for antineoplastic chemotherapy: Secondary | ICD-10-CM | POA: Diagnosis present

## 2022-12-12 DIAGNOSIS — Z170 Estrogen receptor positive status [ER+]: Secondary | ICD-10-CM | POA: Diagnosis not present

## 2022-12-12 DIAGNOSIS — C50212 Malignant neoplasm of upper-inner quadrant of left female breast: Secondary | ICD-10-CM | POA: Diagnosis not present

## 2022-12-12 MED ORDER — FULVESTRANT 250 MG/5ML IM SOSY
500.0000 mg | PREFILLED_SYRINGE | Freq: Once | INTRAMUSCULAR | Status: AC
  Administered 2022-12-12: 500 mg via INTRAMUSCULAR
  Filled 2022-12-12: qty 10

## 2022-12-14 ENCOUNTER — Ambulatory Visit: Payer: Medicare Other

## 2023-01-11 ENCOUNTER — Inpatient Hospital Stay: Payer: Medicare Other | Attending: Hematology and Oncology

## 2023-01-11 VITALS — BP 118/63 | HR 98 | Temp 99.6°F | Resp 16

## 2023-01-11 DIAGNOSIS — Z5111 Encounter for antineoplastic chemotherapy: Secondary | ICD-10-CM | POA: Diagnosis present

## 2023-01-11 DIAGNOSIS — Z170 Estrogen receptor positive status [ER+]: Secondary | ICD-10-CM | POA: Insufficient documentation

## 2023-01-11 DIAGNOSIS — C50212 Malignant neoplasm of upper-inner quadrant of left female breast: Secondary | ICD-10-CM | POA: Diagnosis not present

## 2023-01-11 MED ORDER — FULVESTRANT 250 MG/5ML IM SOSY
500.0000 mg | PREFILLED_SYRINGE | Freq: Once | INTRAMUSCULAR | Status: AC
  Administered 2023-01-11: 500 mg via INTRAMUSCULAR
  Filled 2023-01-11: qty 10

## 2023-01-14 ENCOUNTER — Telehealth: Payer: Self-pay | Admitting: Hematology and Oncology

## 2023-01-14 NOTE — Telephone Encounter (Signed)
Left a voicemail in regards to rescheduled appointment times/dates due to provider being out of office

## 2023-02-06 ENCOUNTER — Inpatient Hospital Stay: Payer: Medicare Other | Attending: Hematology and Oncology

## 2023-02-06 DIAGNOSIS — Z5111 Encounter for antineoplastic chemotherapy: Secondary | ICD-10-CM | POA: Insufficient documentation

## 2023-02-06 DIAGNOSIS — Z1732 Human epidermal growth factor receptor 2 negative status: Secondary | ICD-10-CM | POA: Insufficient documentation

## 2023-02-06 DIAGNOSIS — E11649 Type 2 diabetes mellitus with hypoglycemia without coma: Secondary | ICD-10-CM | POA: Insufficient documentation

## 2023-02-06 DIAGNOSIS — M1990 Unspecified osteoarthritis, unspecified site: Secondary | ICD-10-CM | POA: Insufficient documentation

## 2023-02-06 DIAGNOSIS — Z1722 Progesterone receptor negative status: Secondary | ICD-10-CM | POA: Insufficient documentation

## 2023-02-06 DIAGNOSIS — C50212 Malignant neoplasm of upper-inner quadrant of left female breast: Secondary | ICD-10-CM | POA: Insufficient documentation

## 2023-02-06 DIAGNOSIS — D649 Anemia, unspecified: Secondary | ICD-10-CM | POA: Insufficient documentation

## 2023-02-06 DIAGNOSIS — Z170 Estrogen receptor positive status [ER+]: Secondary | ICD-10-CM | POA: Insufficient documentation

## 2023-02-07 ENCOUNTER — Ambulatory Visit: Payer: Medicare Other | Admitting: Podiatry

## 2023-02-11 ENCOUNTER — Other Ambulatory Visit (HOSPITAL_COMMUNITY): Payer: Self-pay

## 2023-02-11 IMAGING — US US BREAST*L* LIMITED INC AXILLA
1 series · 13 of 13 positions shown · non-contrast
Comparison: Previous exam(s).

CLINICAL DATA: [AGE] female with biopsy-proven left breast
cancer, metastatic to the left axillary lymph nodes. Patient is
currently on oral therapy and has not had definitive surgery.

EXAM:
DIGITAL DIAGNOSTIC BILATERAL MAMMOGRAM WITH TOMOSYNTHESIS AND CAD;
ULTRASOUND LEFT BREAST LIMITED
TECHNIQUE: Bilateral digital diagnostic mammography and breast tomosynthesis
was performed. The images were evaluated with computer-aided
detection.; Targeted ultrasound examination of the left breast was
performed.

[Series 1: us breast*left* limited inc axilla · 0.06mm/px · 13 of 13 slices shown]
[im 1/13]
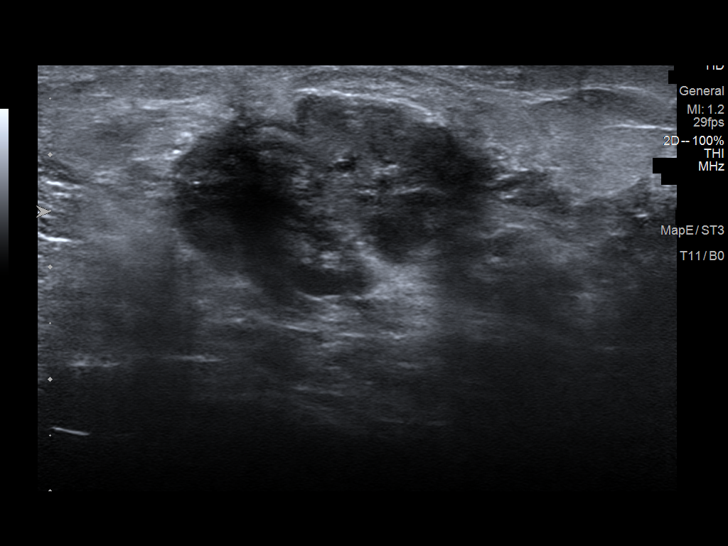
[im 2/13]
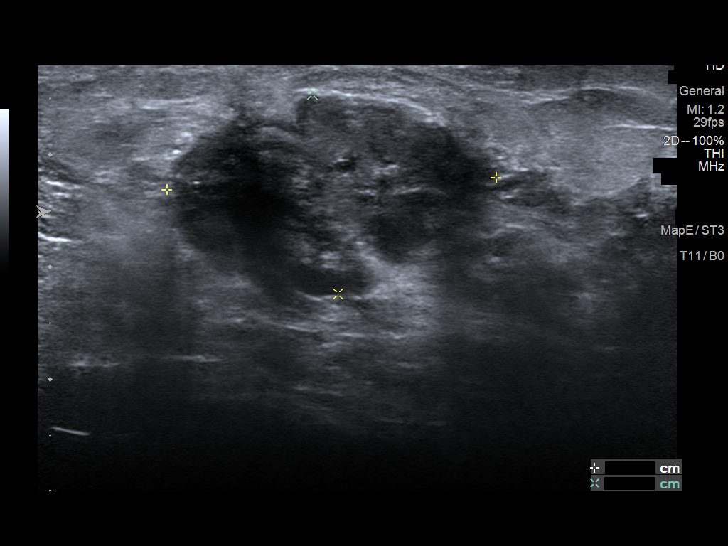
[im 3/13]
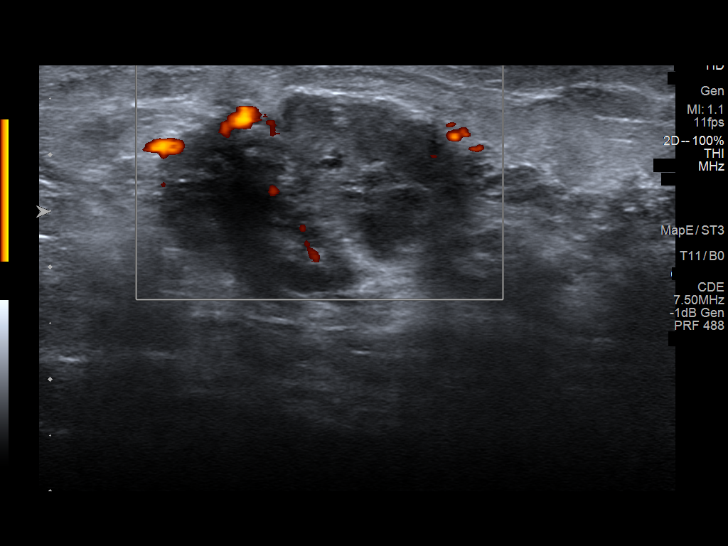
[im 4/13]
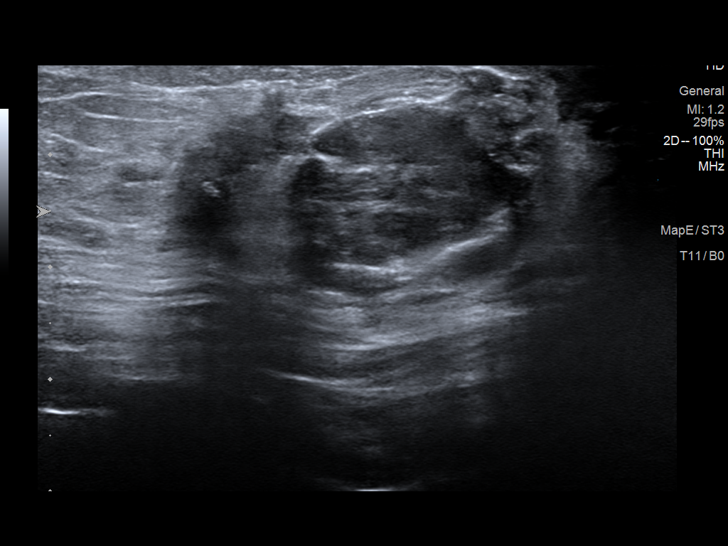
[im 5/13]
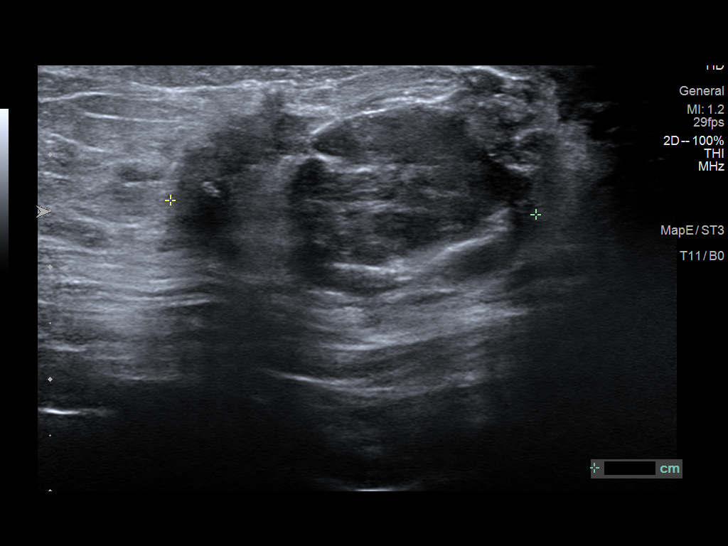
[im 6/13]
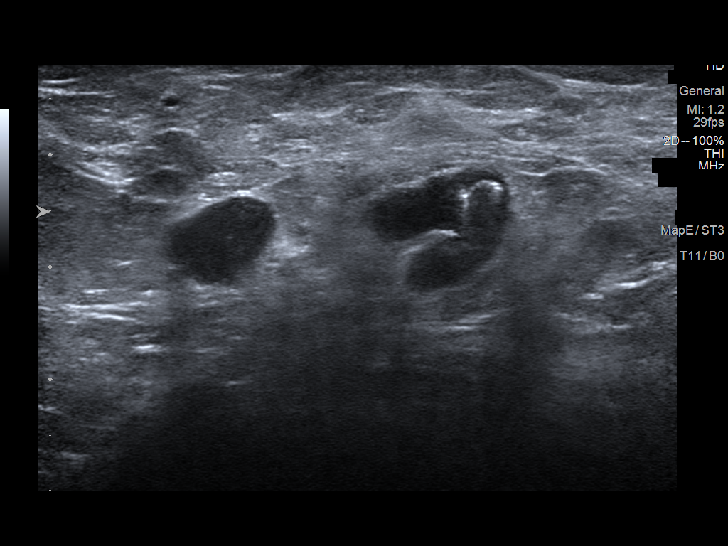
[im 7/13]
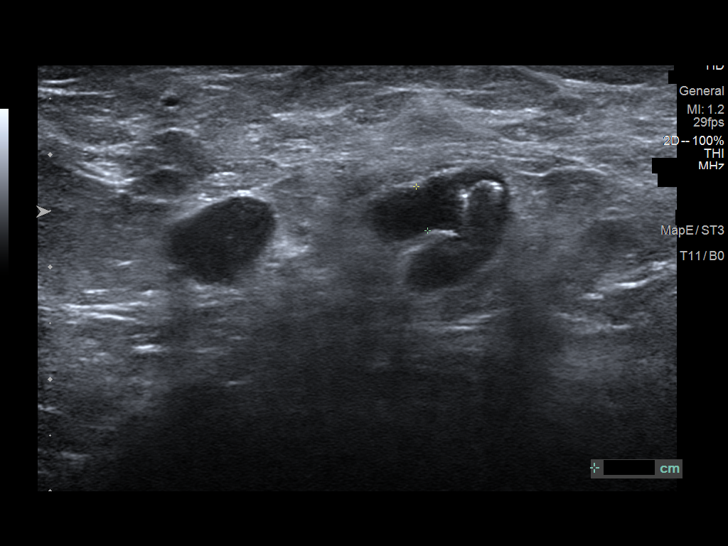
[im 8/13]
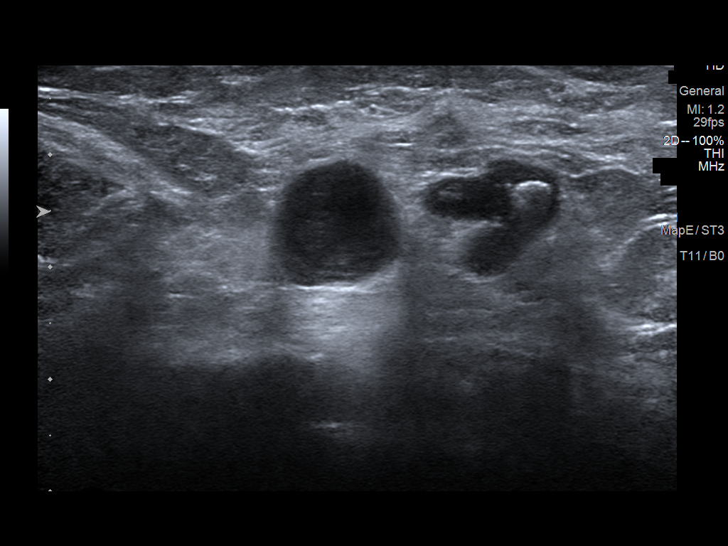
[im 9/13]
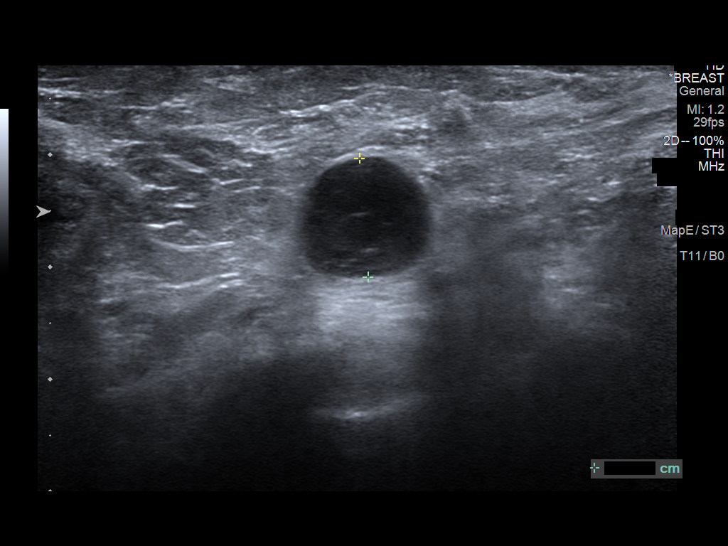
[im 10/13]
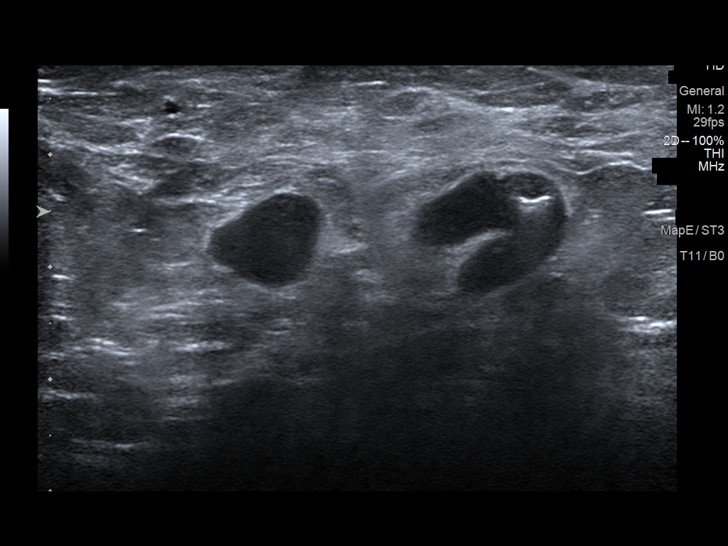
[im 11/13]
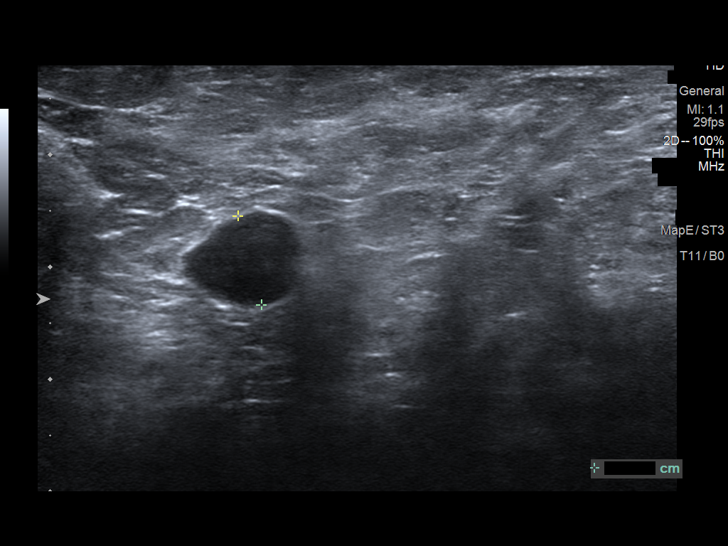
[im 12/13]
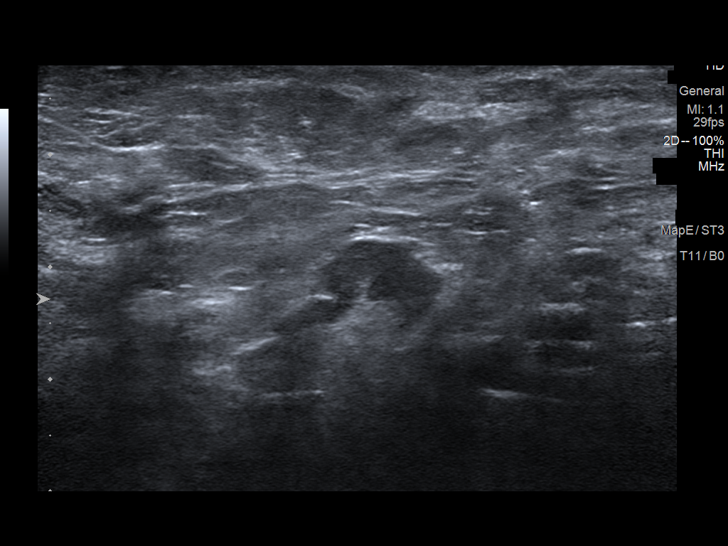
[im 13/13]
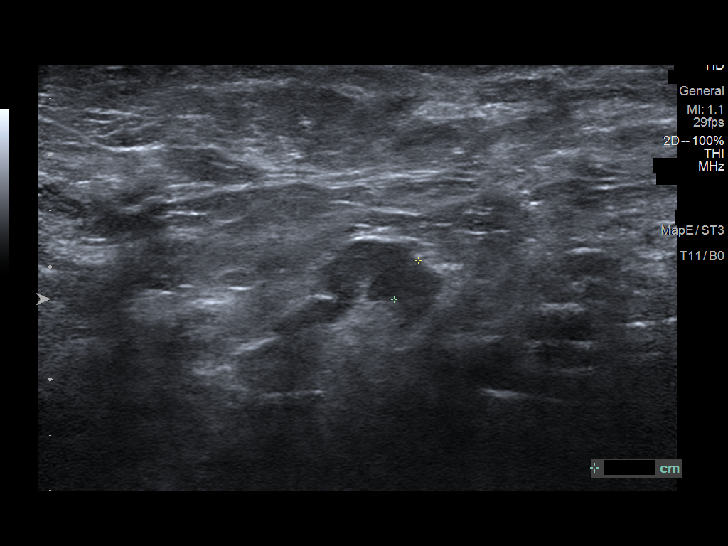

[13 of 13 positions shown; findings below may reference images not displayed]

ACR Breast Density Category c: The breast tissue is heterogeneously
dense, which may obscure small masses.
FINDINGS: Spiculated mass with associated post biopsy changes in the medial
left breast is more prominent on today's study when compared to
priors. There is new, associated skin retraction and dimpling.
Otherwise, no new or suspicious findings in the remainder of either
breast.

Targeted ultrasound is performed, showing interval increase in the
fullness of the irregular, hypoechoic mass along the 11 o'clock axis
of the left breast. Today it measures 3.6 x 2.9 x 1.8 cm, compared
to 3.3 x 2.5 x 1.4 cm when measured in the same plane. Additionally,
there is been interval increase in cortical thickening of multiple
left axillary lymph nodes the previously biopsied lymph node now
measures 4 mm (compared to 3 mm previously) and additional lymph
nodes in the vicinity now measure up to 1.1 cm, enlarged from prior.
IMPRESSION: 1. Slight interval progression of disease within the left breast and
left axilla when compared to study from September 2019.
2. No mammographic evidence of malignancy on the right.

RECOMMENDATION:
Per clinical treatment plan.

I have discussed the findings and recommendations with the patient.
If applicable, a reminder letter will be sent to the patient
regarding the next appointment.

BI-RADS CATEGORY  6: Known biopsy-proven malignancy.

## 2023-02-12 ENCOUNTER — Inpatient Hospital Stay: Payer: Medicare Other

## 2023-02-12 ENCOUNTER — Inpatient Hospital Stay (HOSPITAL_BASED_OUTPATIENT_CLINIC_OR_DEPARTMENT_OTHER): Payer: Medicare Other | Admitting: Hematology and Oncology

## 2023-02-12 VITALS — BP 149/63 | HR 81 | Temp 98.7°F | Resp 19 | Ht 62.0 in | Wt 163.5 lb

## 2023-02-12 DIAGNOSIS — Z1732 Human epidermal growth factor receptor 2 negative status: Secondary | ICD-10-CM | POA: Diagnosis not present

## 2023-02-12 DIAGNOSIS — Z1722 Progesterone receptor negative status: Secondary | ICD-10-CM | POA: Diagnosis not present

## 2023-02-12 DIAGNOSIS — E11649 Type 2 diabetes mellitus with hypoglycemia without coma: Secondary | ICD-10-CM | POA: Diagnosis not present

## 2023-02-12 DIAGNOSIS — C50212 Malignant neoplasm of upper-inner quadrant of left female breast: Secondary | ICD-10-CM | POA: Diagnosis not present

## 2023-02-12 DIAGNOSIS — Z170 Estrogen receptor positive status [ER+]: Secondary | ICD-10-CM | POA: Diagnosis not present

## 2023-02-12 DIAGNOSIS — D649 Anemia, unspecified: Secondary | ICD-10-CM | POA: Diagnosis not present

## 2023-02-12 DIAGNOSIS — M1990 Unspecified osteoarthritis, unspecified site: Secondary | ICD-10-CM | POA: Diagnosis not present

## 2023-02-12 DIAGNOSIS — Z5111 Encounter for antineoplastic chemotherapy: Secondary | ICD-10-CM | POA: Diagnosis present

## 2023-02-12 LAB — CBC WITH DIFFERENTIAL (CANCER CENTER ONLY)
Abs Immature Granulocytes: 0 10*3/uL (ref 0.00–0.07)
Basophils Absolute: 0 10*3/uL (ref 0.0–0.1)
Basophils Relative: 0 %
Eosinophils Absolute: 0.1 10*3/uL (ref 0.0–0.5)
Eosinophils Relative: 1 %
HCT: 32.1 % — ABNORMAL LOW (ref 36.0–46.0)
Hemoglobin: 10.1 g/dL — ABNORMAL LOW (ref 12.0–15.0)
Immature Granulocytes: 0 %
Lymphocytes Relative: 31 %
Lymphs Abs: 1.1 10*3/uL (ref 0.7–4.0)
MCH: 30.6 pg (ref 26.0–34.0)
MCHC: 31.5 g/dL (ref 30.0–36.0)
MCV: 97.3 fL (ref 80.0–100.0)
Monocytes Absolute: 0.3 10*3/uL (ref 0.1–1.0)
Monocytes Relative: 9 %
Neutro Abs: 2.1 10*3/uL (ref 1.7–7.7)
Neutrophils Relative %: 59 %
Platelet Count: 158 10*3/uL (ref 150–400)
RBC: 3.3 MIL/uL — ABNORMAL LOW (ref 3.87–5.11)
RDW: 13.4 % (ref 11.5–15.5)
WBC Count: 3.6 10*3/uL — ABNORMAL LOW (ref 4.0–10.5)
nRBC: 0 % (ref 0.0–0.2)

## 2023-02-12 LAB — CMP (CANCER CENTER ONLY)
ALT: 9 U/L (ref 0–44)
AST: 12 U/L — ABNORMAL LOW (ref 15–41)
Albumin: 4 g/dL (ref 3.5–5.0)
Alkaline Phosphatase: 49 U/L (ref 38–126)
Anion gap: 6 (ref 5–15)
BUN: 13 mg/dL (ref 8–23)
CO2: 29 mmol/L (ref 22–32)
Calcium: 10 mg/dL (ref 8.9–10.3)
Chloride: 106 mmol/L (ref 98–111)
Creatinine: 0.73 mg/dL (ref 0.44–1.00)
GFR, Estimated: >60 mL/min (ref >60)
Glucose, Bld: 147 mg/dL — ABNORMAL HIGH (ref 70–99)
Potassium: 4.1 mmol/L (ref 3.5–5.1)
Sodium: 141 mmol/L (ref 135–145)
Total Bilirubin: 0.5 mg/dL (ref 0.0–1.2)
Total Protein: 7.3 g/dL (ref 6.5–8.1)

## 2023-02-12 MED ORDER — FULVESTRANT 250 MG/5ML IM SOSY
500.0000 mg | PREFILLED_SYRINGE | Freq: Once | INTRAMUSCULAR | Status: AC
  Administered 2023-02-12: 500 mg via INTRAMUSCULAR
  Filled 2023-02-12: qty 10

## 2023-02-12 NOTE — Assessment & Plan Note (Signed)
08/26/2018:Palpable mass 4.2cm in the upper inner left breast at the 11 o'clock position with 4 enlarged axillary lymph nodes. Biopsy on 08/26/18 showed invasive lobular carcinoma, grade 1-2, HER-2 negative (1+), ER 100%, PR 0%, Ki67 15% in the left breast and axilla. T2N1 stage IIa   CT scans showed the breast cancer and the lymphadenopathy in the axilla and the subpectoral areas.  In addition to this she had a left kidney mass measuring 4.5 x 3.5 cm.  Could be a primary renal cell cancer.  But given her age and health issues no surgical approaches are planned.   Current treatment: Palliative treatment with anastrozole 1 mg daily discontinued 08/10/2021 switched to Faslodex August 2023   Breast cancer surveillance: 1. left breast mammogram and ultrasound 05/15/2022: 3.6 cm left breast mass with left axillary lymph nodes (stable disease) 2. breast exam 02/04/2023: Benign     Return to clinic monthly for Faslodex injections Will continue with the monthly Faslodex injections .

## 2023-02-12 NOTE — Progress Notes (Signed)
Patient Care Team: Serena Croissant, MD as PCP - General (Hematology and Oncology)  DIAGNOSIS:  Encounter Diagnosis  Name Primary?   Malignant neoplasm of upper-inner quadrant of left breast in female, estrogen receptor positive (HCC) Yes    SUMMARY OF ONCOLOGIC HISTORY: Oncology History  Malignant neoplasm of upper-inner quadrant of left breast in female, estrogen receptor positive (HCC)  08/26/2018 Initial Diagnosis   Palpable mass 4.2cm in the upper inner left breast at the 11 o'clock position with 4 enlarged axillary lymph nodes. Biopsy on 08/26/18 showed invasive mammary carcinoma, grade 1, HER-2 negative (1+), ER 100%, PR 0%, Ki67 15% in the left breast and axilla.   08/26/2018 Cancer Staging   Staging form: Breast, AJCC 8th Edition - Clinical stage from 08/26/2018: Stage IIB (cT2, cN1, cM0, G1, ER+, PR-, HER2-) - Signed by Loa Socks, NP on 09/03/2018   09/02/2018 -  Anti-estrogen oral therapy   Palliative anti-estrogen therapy with anastrozole      CHIEF COMPLIANT: Follow-up on Faslodex injections  HISTORY OF PRESENT ILLNESS:   History of Present Illness   The patient is a 89 year old female with breast cancer who presents for routine follow-up and injections. She is accompanied by her granddaughter.  She is currently undergoing treatment for breast cancer and receives injections regularly. She manages the treatment well, describing it as 'one day at a time', and does not experience significant pain or discomfort. She is able to maintain her daily activities despite her age.  She has a history of anemia with hemoglobin levels consistently around 10.1 over the past two years. Her blood work remains stable and within her expected range.  She has diabetes and recently experienced a low blood sugar episode with a reading of 70. Her granddaughter advises her to eat before exerting herself in the morning to prevent such episodes.  No hot flashes are reported, but she  experiences joint stiffness and achiness, which she attributes to arthritis. Despite this, she maintains mobility and is thankful for her ability to perform some activities.  Her husband passed away from a heart attack on December 21st, which was sudden and devastating. She had been married for 43 years. She recounts the events leading to his passing, including his complaint of a headache and subsequent collapse.         ALLERGIES:  is allergic to other.  MEDICATIONS:  Current Outpatient Medications  Medication Sig Dispense Refill   ACCU-CHEK SMARTVIEW test strip      amLODipine (NORVASC) 10 MG tablet Take 10 mg by mouth daily.     atorvastatin (LIPITOR) 40 MG tablet Take 40 mg by mouth at bedtime.      brimonidine (ALPHAGAN) 0.2 % ophthalmic solution INSTILL 1 DROP INTO RIGHT EYE TWICE DAILY     calcium carbonate (OS-CAL) 600 MG TABS tablet Take 600 mg by mouth every morning.      clotrimazole-betamethasone (LOTRISONE) cream clotrimazole-betamethasone 1 %-0.05 % topical cream     Coenzyme Q10 (CO Q 10 PO) Take 1 tablet by mouth daily.     diclofenac Sodium (VOLTAREN) 1 % GEL diclofenac 1 % topical gel     Flaxseed, Linseed, (FLAXSEED OIL PO) Take 1 tablet by mouth daily.      GARLIC PO Take 1 tablet by mouth daily.      glucosamine-chondroitin 500-400 MG tablet Take 1 tablet by mouth daily.     glucose blood test strip Accu-Chek SmartView Test Strips  CHECK BLOOD SUGAR TWICE  DAILY  ketorolac (TORADOL) 60 MG/2ML SOLN injection ketorolac 60 mg/2 mL intramuscular solution  Inject 60 mg IM x 1     latanoprost (XALATAN) 0.005 % ophthalmic solution Place 1 drop into the right eye at bedtime.      meclizine (ANTIVERT) 25 MG tablet meclizine 25 mg tablet     metFORMIN (GLUCOPHAGE) 500 MG tablet Take 500 mg by mouth 2 (two) times daily with a meal.      predniSONE (DELTASONE) 5 MG tablet prednisone 5 mg tablets in a dose pack  TAKE AS DIRECTED FOR 6 DAYS     No current  facility-administered medications for this visit.    PHYSICAL EXAMINATION: ECOG PERFORMANCE STATUS: 1 - Symptomatic but completely ambulatory  Vitals:   02/12/23 1451 02/12/23 1453  BP: (!) 150/58 (!) 149/63  Pulse: 81   Resp: 19   Temp: 98.7 F (37.1 C)   SpO2: 100%    Filed Weights   02/12/23 1451  Weight: 163 lb 8 oz (74.2 kg)      LABORATORY DATA:  I have reviewed the data as listed    Latest Ref Rng & Units 02/12/2023    2:25 PM 12/13/2021    2:46 PM 08/10/2021    2:43 PM  CMP  Glucose 70 - 99 mg/dL 409  811  90   BUN 8 - 23 mg/dL 13  14  18    Creatinine 0.44 - 1.00 mg/dL 9.14  7.82  9.56   Sodium 135 - 145 mmol/L 141  138  140   Potassium 3.5 - 5.1 mmol/L 4.1  4.2  3.9   Chloride 98 - 111 mmol/L 106  105  105   CO2 22 - 32 mmol/L 29  27  29    Calcium 8.9 - 10.3 mg/dL 21.3  9.6  08.6   Total Protein 6.5 - 8.1 g/dL 7.3  6.8  7.8   Total Bilirubin 0.0 - 1.2 mg/dL 0.5  0.6  0.5   Alkaline Phos 38 - 126 U/L 49  48  58   AST 15 - 41 U/L 12  14  13    ALT 0 - 44 U/L 9  11  8      Lab Results  Component Value Date   WBC 3.6 (L) 02/12/2023   HGB 10.1 (L) 02/12/2023   HCT 32.1 (L) 02/12/2023   MCV 97.3 02/12/2023   PLT 158 02/12/2023   NEUTROABS 2.1 02/12/2023    ASSESSMENT & PLAN:  Malignant neoplasm of upper-inner quadrant of left breast in female, estrogen receptor positive (HCC) 08/26/2018:Palpable mass 4.2cm in the upper inner left breast at the 11 o'clock position with 4 enlarged axillary lymph nodes. Biopsy on 08/26/18 showed invasive lobular carcinoma, grade 1-2, HER-2 negative (1+), ER 100%, PR 0%, Ki67 15% in the left breast and axilla. T2N1 stage IIa   CT scans showed the breast cancer and the lymphadenopathy in the axilla and the subpectoral areas.  In addition to this she had a left kidney mass measuring 4.5 x 3.5 cm.  Could be a primary renal cell cancer.  But given her age and health issues no surgical approaches are planned.   Current treatment:  Palliative treatment with anastrozole 1 mg daily discontinued 08/10/2021 switched to Faslodex August 2023   Breast cancer surveillance: 1. left breast mammogram and ultrasound 05/15/2022: 3.6 cm left breast mass with left axillary lymph nodes (stable disease) 2. breast exam 02/04/2023: Benign     Return to clinic monthly for Faslodex injections Will  continue with the monthly Faslodex injections . ------------------------------------- Assessment and Plan    Breast Cancer   Patient is continuing with injections for breast cancer treatment without pain, discomfort, or hot flashes. She wishes to continue as long as manageable.   - Continue current injection regimen   - Limit appointments to injection visits only   - Periodic review of blood work    Anemia   Mild anemia with hemoglobin levels around 10.1, stable over the past two years, likely due to age-related bone marrow reserve depletion.   - No specific intervention required    Arthritis   Reports joint stiffness and achiness attributed to arthritis but remains able to perform daily activities.   - Monitor symptoms and manage as needed    Diabetes Mellitus   Diabetes with a recent hypoglycemic episode (blood sugar level of 70). Emphasized the importance of eating breakfast before morning activities.   - Advise eating breakfast before morning activities to prevent hypoglycemia    General Health Maintenance   Overall health is stable with supportive family and ability to perform daily activities with some assistance. Approaching 89th birthday with a family history of longevity.   - Continue regular follow-up appointments   - Encourage maintaining current level of physical activity and independence    Follow-up   - Schedule follow-up appointments as needed   - Periodic review of blood work results.          No orders of the defined types were placed in this encounter.  The patient has a good understanding of the overall plan.  she agrees with it. she will call with any problems that may develop before the next visit here. Total time spent: 30 mins including face to face time and time spent for planning, charting and co-ordination of care   Tamsen Meek, MD 02/12/23

## 2023-03-06 ENCOUNTER — Other Ambulatory Visit: Payer: Medicare Other

## 2023-03-06 ENCOUNTER — Ambulatory Visit: Payer: Medicare Other

## 2023-03-06 ENCOUNTER — Ambulatory Visit: Payer: Medicare Other | Admitting: Hematology and Oncology

## 2023-03-07 ENCOUNTER — Ambulatory Visit: Payer: Medicare Other

## 2023-03-07 ENCOUNTER — Ambulatory Visit: Payer: Medicare Other | Admitting: Podiatry

## 2023-03-07 ENCOUNTER — Ambulatory Visit: Payer: Medicare Other | Admitting: Hematology and Oncology

## 2023-03-07 ENCOUNTER — Other Ambulatory Visit: Payer: Medicare Other

## 2023-03-11 ENCOUNTER — Other Ambulatory Visit: Payer: Self-pay

## 2023-03-11 DIAGNOSIS — C50212 Malignant neoplasm of upper-inner quadrant of left female breast: Secondary | ICD-10-CM

## 2023-03-12 ENCOUNTER — Inpatient Hospital Stay: Payer: Medicare Other

## 2023-03-12 ENCOUNTER — Inpatient Hospital Stay: Payer: Medicare Other | Attending: Hematology and Oncology | Admitting: Hematology and Oncology

## 2023-03-12 VITALS — BP 153/66 | HR 82 | Temp 98.4°F | Resp 18 | Ht 62.0 in | Wt 160.4 lb

## 2023-03-12 DIAGNOSIS — C50212 Malignant neoplasm of upper-inner quadrant of left female breast: Secondary | ICD-10-CM | POA: Insufficient documentation

## 2023-03-12 DIAGNOSIS — Z1732 Human epidermal growth factor receptor 2 negative status: Secondary | ICD-10-CM | POA: Diagnosis not present

## 2023-03-12 DIAGNOSIS — Z5111 Encounter for antineoplastic chemotherapy: Secondary | ICD-10-CM | POA: Insufficient documentation

## 2023-03-12 DIAGNOSIS — Z1722 Progesterone receptor negative status: Secondary | ICD-10-CM | POA: Diagnosis not present

## 2023-03-12 DIAGNOSIS — Z170 Estrogen receptor positive status [ER+]: Secondary | ICD-10-CM | POA: Diagnosis not present

## 2023-03-12 DIAGNOSIS — Z79811 Long term (current) use of aromatase inhibitors: Secondary | ICD-10-CM | POA: Insufficient documentation

## 2023-03-12 LAB — CMP (CANCER CENTER ONLY)
ALT: 8 U/L (ref 0–44)
AST: 13 U/L — ABNORMAL LOW (ref 15–41)
Albumin: 4.1 g/dL (ref 3.5–5.0)
Alkaline Phosphatase: 47 U/L (ref 38–126)
Anion gap: 5 (ref 5–15)
BUN: 16 mg/dL (ref 8–23)
CO2: 29 mmol/L (ref 22–32)
Calcium: 10.2 mg/dL (ref 8.9–10.3)
Chloride: 106 mmol/L (ref 98–111)
Creatinine: 0.69 mg/dL (ref 0.44–1.00)
GFR, Estimated: >60 mL/min (ref >60)
Glucose, Bld: 137 mg/dL — ABNORMAL HIGH (ref 70–99)
Potassium: 4.8 mmol/L (ref 3.5–5.1)
Sodium: 140 mmol/L (ref 135–145)
Total Bilirubin: 0.4 mg/dL (ref 0.0–1.2)
Total Protein: 7.2 g/dL (ref 6.5–8.1)

## 2023-03-12 LAB — CBC WITH DIFFERENTIAL (CANCER CENTER ONLY)
Abs Immature Granulocytes: 0.01 10*3/uL (ref 0.00–0.07)
Basophils Absolute: 0 10*3/uL (ref 0.0–0.1)
Basophils Relative: 0 %
Eosinophils Absolute: 0.1 10*3/uL (ref 0.0–0.5)
Eosinophils Relative: 2 %
HCT: 32.7 % — ABNORMAL LOW (ref 36.0–46.0)
Hemoglobin: 10.4 g/dL — ABNORMAL LOW (ref 12.0–15.0)
Immature Granulocytes: 0 %
Lymphocytes Relative: 32 %
Lymphs Abs: 1.3 10*3/uL (ref 0.7–4.0)
MCH: 30.1 pg (ref 26.0–34.0)
MCHC: 31.8 g/dL (ref 30.0–36.0)
MCV: 94.5 fL (ref 80.0–100.0)
Monocytes Absolute: 0.3 10*3/uL (ref 0.1–1.0)
Monocytes Relative: 8 %
Neutro Abs: 2.3 10*3/uL (ref 1.7–7.7)
Neutrophils Relative %: 58 %
Platelet Count: 150 10*3/uL (ref 150–400)
RBC: 3.46 MIL/uL — ABNORMAL LOW (ref 3.87–5.11)
RDW: 12.7 % (ref 11.5–15.5)
WBC Count: 4.1 10*3/uL (ref 4.0–10.5)
nRBC: 0 % (ref 0.0–0.2)

## 2023-03-12 MED ORDER — FULVESTRANT 250 MG/5ML IM SOSY
500.0000 mg | PREFILLED_SYRINGE | Freq: Once | INTRAMUSCULAR | Status: AC
  Administered 2023-03-12: 500 mg via INTRAMUSCULAR
  Filled 2023-03-12: qty 10

## 2023-03-12 NOTE — Assessment & Plan Note (Signed)
 08/26/2018:Palpable mass 4.2cm in the upper inner left breast at the 11 o'clock position with 4 enlarged axillary lymph nodes. Biopsy on 08/26/18 showed invasive lobular carcinoma, grade 1-2, HER-2 negative (1+), ER 100%, PR 0%, Ki67 15% in the left breast and axilla. T2N1 stage IIa   CT scans showed the breast cancer and the lymphadenopathy in the axilla and the subpectoral areas.  In addition to this she had a left kidney mass measuring 4.5 x 3.5 cm.  Could be a primary renal cell cancer.  But given her age and health issues no surgical approaches are planned.   Current treatment: Palliative treatment with anastrozole 1 mg daily discontinued 08/10/2021 switched to Faslodex August 2023   Breast cancer surveillance: 1. Left breast mammogram and ultrasound 05/15/2022: 3.6 cm left breast mass with left axillary lymph nodes (stable disease) 2. Breast exam 02/04/2023: Benign     Return to clinic monthly for Faslodex injections  Will continue with the monthly Faslodex injections .

## 2023-03-12 NOTE — Progress Notes (Signed)
 Patient Care Team: Serena Croissant, MD as PCP - General (Hematology and Oncology)  DIAGNOSIS:  Encounter Diagnosis  Name Primary?   Malignant neoplasm of upper-inner quadrant of left breast in female, estrogen receptor positive (HCC) Yes    SUMMARY OF ONCOLOGIC HISTORY: Oncology History  Malignant neoplasm of upper-inner quadrant of left breast in female, estrogen receptor positive (HCC)  08/26/2018 Initial Diagnosis   Palpable mass 4.2cm in the upper inner left breast at the 11 o'clock position with 4 enlarged axillary lymph nodes. Biopsy on 08/26/18 showed invasive mammary carcinoma, grade 1, HER-2 negative (1+), ER 100%, PR 0%, Ki67 15% in the left breast and axilla.   08/26/2018 Cancer Staging   Staging form: Breast, AJCC 8th Edition - Clinical stage from 08/26/2018: Stage IIB (cT2, cN1, cM0, G1, ER+, PR-, HER2-) - Signed by Loa Socks, NP on 09/03/2018   09/02/2018 -  Anti-estrogen oral therapy   Palliative anti-estrogen therapy with anastrozole      CHIEF COMPLIANT: Follow-up on Faslodex injections  HISTORY OF PRESENT ILLNESS: April Gallagher is a 89 year old with above history of breast cancer is currently on injections with Faslodex.  Overall other than the slight discomfort she has tolerated the treatment fairly well.  She is planned to undergo another mammogram in April.  She is in a wheelchair but reports that overall her health status has been stable.  Recently she turned 89 and had a wonderful celebration.  ALLERGIES:  is allergic to other.  MEDICATIONS:  Current Outpatient Medications  Medication Sig Dispense Refill   ACCU-CHEK SMARTVIEW test strip      amLODipine (NORVASC) 10 MG tablet Take 10 mg by mouth daily.     atorvastatin (LIPITOR) 40 MG tablet Take 40 mg by mouth at bedtime.      brimonidine (ALPHAGAN) 0.2 % ophthalmic solution INSTILL 1 DROP INTO RIGHT EYE TWICE DAILY     calcium carbonate (OS-CAL) 600 MG TABS tablet Take 600 mg by mouth every morning.       clotrimazole-betamethasone (LOTRISONE) cream clotrimazole-betamethasone 1 %-0.05 % topical cream     Coenzyme Q10 (CO Q 10 PO) Take 1 tablet by mouth daily.     diclofenac Sodium (VOLTAREN) 1 % GEL diclofenac 1 % topical gel     Flaxseed, Linseed, (FLAXSEED OIL PO) Take 1 tablet by mouth daily.      GARLIC PO Take 1 tablet by mouth daily.      glucosamine-chondroitin 500-400 MG tablet Take 1 tablet by mouth daily.     glucose blood test strip Accu-Chek SmartView Test Strips  CHECK BLOOD SUGAR TWICE  DAILY     ketorolac (TORADOL) 60 MG/2ML SOLN injection ketorolac 60 mg/2 mL intramuscular solution  Inject 60 mg IM x 1     latanoprost (XALATAN) 0.005 % ophthalmic solution Place 1 drop into the right eye at bedtime.      meclizine (ANTIVERT) 25 MG tablet meclizine 25 mg tablet     metFORMIN (GLUCOPHAGE) 500 MG tablet Take 500 mg by mouth 2 (two) times daily with a meal.      predniSONE (DELTASONE) 5 MG tablet prednisone 5 mg tablets in a dose pack  TAKE AS DIRECTED FOR 6 DAYS     No current facility-administered medications for this visit.    PHYSICAL EXAMINATION: ECOG PERFORMANCE STATUS: 1 - Symptomatic but completely ambulatory  Vitals:   03/12/23 1407  BP: (!) 153/66  Pulse: 82  Resp: 18  Temp: 98.4 F (36.9 C)  SpO2: 98%  Filed Weights   03/12/23 1407  Weight: 160 lb 6.4 oz (72.8 kg)    Physical Exam   (exam performed in the presence of a chaperone)  LABORATORY DATA:  I have reviewed the data as listed    Latest Ref Rng & Units 03/12/2023    1:42 PM 02/12/2023    2:25 PM 12/13/2021    2:46 PM  CMP  Glucose 70 - 99 mg/dL 409  811  914   BUN 8 - 23 mg/dL 16  13  14    Creatinine 0.44 - 1.00 mg/dL 7.82  9.56  2.13   Sodium 135 - 145 mmol/L 140  141  138   Potassium 3.5 - 5.1 mmol/L 4.8  4.1  4.2   Chloride 98 - 111 mmol/L 106  106  105   CO2 22 - 32 mmol/L 29  29  27    Calcium 8.9 - 10.3 mg/dL 08.6  57.8  9.6   Total Protein 6.5 - 8.1 g/dL 7.2  7.3  6.8    Total Bilirubin 0.0 - 1.2 mg/dL 0.4  0.5  0.6   Alkaline Phos 38 - 126 U/L 47  49  48   AST 15 - 41 U/L 13  12  14    ALT 0 - 44 U/L 8  9  11      Lab Results  Component Value Date   WBC 4.1 03/12/2023   HGB 10.4 (L) 03/12/2023   HCT 32.7 (L) 03/12/2023   MCV 94.5 03/12/2023   PLT 150 03/12/2023   NEUTROABS 2.3 03/12/2023    ASSESSMENT & PLAN:  Malignant neoplasm of upper-inner quadrant of left breast in female, estrogen receptor positive (HCC) 08/26/2018:Palpable mass 4.2cm in the upper inner left breast at the 11 o'clock position with 4 enlarged axillary lymph nodes. Biopsy on 08/26/18 showed invasive lobular carcinoma, grade 1-2, HER-2 negative (1+), ER 100%, PR 0%, Ki67 15% in the left breast and axilla. T2N1 stage IIa   CT scans showed the breast cancer and the lymphadenopathy in the axilla and the subpectoral areas.  In addition to this she had a left kidney mass measuring 4.5 x 3.5 cm.  Could be a primary renal cell cancer.  But given her age and health issues no surgical approaches are planned.   Current treatment: Palliative treatment with anastrozole 1 mg daily discontinued 08/10/2021 switched to Faslodex August 2023   Breast cancer surveillance: 1. Left breast mammogram and ultrasound 05/15/2022: 3.6 cm left breast mass with left axillary lymph nodes (stable disease) 2. Breast exam 02/04/2023: Benign     Return to clinic monthly for Faslodex injections  Will continue with the monthly Faslodex injections .      Orders Placed This Encounter  Procedures   MM DIAG BREAST TOMO BILATERAL    Standing Status:   Future    Expected Date:   05/16/2023    Expiration Date:   03/11/2024    Reason for Exam (SYMPTOM  OR DIAGNOSIS REQUIRED):   Annual mammograms    [facility]    Release to patient:   Immediate   [place] LIMITED ULTRASOUND INCLUDING AXILLA LEFT BREAST     Standing Status:   Future    Expected Date:   05/16/2023    Expiration Date:    03/11/2024    Reason for Exam (SYMPTOM  OR DIAGNOSIS REQUIRED):   Evaluate breast mass    Preferred Imaging Location?:   [facility]    Release  to patient:   Immediate   The patient has a good understanding of the overall plan. she agrees with it. she will call with any problems that may develop before the next visit here. Total time spent: 30 mins including face to face time and time spent for planning, charting and co-ordination of care   Tamsen Meek, MD 03/12/23

## 2023-03-13 ENCOUNTER — Telehealth: Payer: Self-pay | Admitting: Hematology and Oncology

## 2023-03-13 NOTE — Telephone Encounter (Signed)
 Canceled and scheduled appointments per 2/25 los. Talked with the patients daughter Mrs.Alice and she is aware of the canceled and made appointments for the patient.

## 2023-03-18 ENCOUNTER — Ambulatory Visit (INDEPENDENT_AMBULATORY_CARE_PROVIDER_SITE_OTHER)

## 2023-03-18 ENCOUNTER — Telehealth: Payer: Self-pay

## 2023-03-18 ENCOUNTER — Encounter (HOSPITAL_COMMUNITY): Payer: Self-pay | Admitting: Emergency Medicine

## 2023-03-18 ENCOUNTER — Other Ambulatory Visit: Payer: Self-pay

## 2023-03-18 ENCOUNTER — Ambulatory Visit (HOSPITAL_COMMUNITY)
Admission: EM | Admit: 2023-03-18 | Discharge: 2023-03-18 | Disposition: A | Discharge status: Home / Self Care | Attending: Family Medicine | Admitting: Family Medicine

## 2023-03-18 DIAGNOSIS — R0781 Pleurodynia: Secondary | ICD-10-CM | POA: Diagnosis not present

## 2023-03-18 DIAGNOSIS — J189 Pneumonia, unspecified organism: Secondary | ICD-10-CM

## 2023-03-18 DIAGNOSIS — R051 Acute cough: Secondary | ICD-10-CM

## 2023-03-18 LAB — POC COVID19/FLU A&B COMBO
Covid Antigen, POC: NEGATIVE
Influenza A Antigen, POC: NEGATIVE
Influenza B Antigen, POC: NEGATIVE

## 2023-03-18 MED ORDER — TRAMADOL HCL 50 MG PO TABS: 50.0000 mg | ORAL_TABLET | Freq: Four times a day (QID) | ORAL | 0 refills | Status: DC | PRN

## 2023-03-18 MED ORDER — BENZONATATE 100 MG PO CAPS: 100.0000 mg | ORAL_CAPSULE | Freq: Three times a day (TID) | ORAL | 0 refills | Status: DC | PRN

## 2023-03-18 MED ORDER — KETOROLAC TROMETHAMINE 30 MG/ML IJ SOLN
INTRAMUSCULAR | Status: AC
  Filled 2023-03-18: qty 1

## 2023-03-18 MED ORDER — DOXYCYCLINE HYCLATE 100 MG PO CAPS: 100.0000 mg | ORAL_CAPSULE | Freq: Two times a day (BID) | ORAL | 0 refills | Status: AC

## 2023-03-18 MED ORDER — KETOROLAC TROMETHAMINE 15 MG/ML IJ SOLN
15.0000 mg | Freq: Once | INTRAMUSCULAR | Status: AC
  Administered 2023-03-18: 15 mg via INTRAMUSCULAR

## 2023-03-18 NOTE — Telephone Encounter (Signed)
 Pt's daughter called and states she Has been c/o RUQ abd pain for over a week, but failed to mention this to Dr Pamelia Hoit at her visit last week.  She reports having a nagging cough and wonders if pain could be a cracked rib from coughing.   S/w [facility], Jae Dire regardnig symptoms and she advised for pt to contact her PCP or go to Urgent Care where they can perform a CXR and a nasal swab for further investigation. Pt's daughter was advised of this and is in agreement.

## 2023-03-18 NOTE — ED Notes (Signed)
 Adult daughter is with patient

## 2023-03-18 NOTE — ED Provider Notes (Addendum)
 [facility]    CSN: [id number] Arrival date & time: 03/18/23  1408      History   Chief Complaint Chief Complaint  Patient presents with   Flank Pain    HPI April Gallagher is a 89 y.o. female.    Flank Pain  Here for pain in her right lower rib cage.  It began about 5 days ago.  No fall or trauma.  It has been worsening and then yesterday she started having some clear rhinorrhea.  She had had some cough that apparently was mild in the first few days of this but now it is also worse.  It hurts to roll over in the bed and it hurts to flex in her right hip.  No fever noted at home.  Daughter relates that they did put a topical pain patch on last night and it helped.  She is not allergic any medications.  She does take Faslodex for history of breast cancer that was first diagnosed in 2020.    Past Medical History:  Diagnosis Date   Arthritis    Breast cancer (HCC) 2020   Left Breast Cancer   Diabetes mellitus without complication (HCC)    Hypertension     Patient Active Problem List   Diagnosis Date Noted   Type 2 diabetes mellitus with vascular disease (HCC) 01/24/2021   Diabetic neuropathy (HCC) 01/24/2021   Malignant neoplasm of upper-inner quadrant of left breast in female, estrogen receptor positive (HCC) 09/02/2018   Benign essential hypertension 01/29/2018   Hyperlipidemia 01/29/2018   Obesity 01/29/2018   Osteoarthritis of knee 01/29/2018   Osteoporosis 01/29/2018   Type 2 diabetes mellitus without complication (HCC) 01/29/2018   Vitamin D deficiency 01/29/2018   Abnormal gait 10/13/2014   Low back pain 07/05/2014   Secondary open-angle glaucoma of right eye, moderate stage 05/15/2014   Pseudophakia of both eyes 11/09/2013   PCO (posterior capsular opacification) 05/27/2013    Past Surgical History:  Procedure Laterality Date   BREAST BIOPSY Left 2020   BREAST EXCISIONAL BIOPSY Right     OB History   No obstetric history on file.       Home Medications    Prior to Admission medications   Medication Sig Start Date End Date Taking? Authorizing Provider  benzonatate (TESSALON) 100 MG capsule Take 1 capsule (100 mg total) by mouth 3 (three) times daily as needed for cough. 03/18/23  Yes Zenia Resides, MD  doxycycline (VIBRAMYCIN) 100 MG capsule Take 1 capsule (100 mg total) by mouth 2 (two) times daily for 7 days. 03/18/23 03/25/23 Yes Elizjah Noblet, Janace Aris, MD  traMADol (ULTRAM) 50 MG tablet Take 1 tablet (50 mg total) by mouth every 6 (six) hours as needed (pain). 03/18/23  Yes Zenia Resides, MD  ACCU-CHEK SMARTVIEW test strip  08/06/18   [provider]  amLODipine (NORVASC) 10 MG tablet Take 10 mg by mouth daily.    [provider]  atorvastatin (LIPITOR) 40 MG tablet Take 40 mg by mouth at bedtime.     [provider]  brimonidine (ALPHAGAN) 0.2 % ophthalmic solution INSTILL 1 DROP INTO RIGHT EYE TWICE DAILY 07/30/18   [provider]  calcium carbonate (OS-CAL) 600 MG TABS tablet Take 600 mg by mouth every morning.     [provider]  clotrimazole-betamethasone (LOTRISONE) cream clotrimazole-betamethasone 1 %-0.05 % topical cream    [provider]  Coenzyme Q10 (CO Q 10 PO) Take 1 tablet by mouth  daily.    [provider]  diclofenac Sodium (VOLTAREN) 1 % GEL diclofenac 1 % topical gel    [provider]  Flaxseed, Linseed, (FLAXSEED OIL PO) Take 1 tablet by mouth daily.     [provider]  GARLIC PO Take 1 tablet by mouth daily.     [provider]  glucosamine-chondroitin 500-400 MG tablet Take 1 tablet by mouth daily.    [provider]  glucose blood test strip Accu-Chek SmartView Test Strips  CHECK BLOOD SUGAR TWICE  DAILY    [provider]  ketorolac (TORADOL) 60 MG/2ML SOLN injection ketorolac 60 mg/2 mL intramuscular solution  Inject 60 mg IM x 1    [provider]  latanoprost (XALATAN)  0.005 % ophthalmic solution Place 1 drop into the right eye at bedtime.     [provider]  meclizine (ANTIVERT) 25 MG tablet meclizine 25 mg tablet    [provider]  metFORMIN (GLUCOPHAGE) 500 MG tablet Take 500 mg by mouth 2 (two) times daily with a meal.     [provider]  predniSONE (DELTASONE) 5 MG tablet prednisone 5 mg tablets in a dose pack  TAKE AS DIRECTED FOR 6 DAYS    [provider]    Family History History reviewed. No pertinent family history.  Social History Social History   Tobacco Use   Smoking status: Never   Smokeless tobacco: Never  Vaping Use   Vaping status: Never Used  Substance Use Topics   Alcohol use: No   Drug use: Never     Allergies   Patient has no active allergies.   Review of Systems Review of Systems  Genitourinary:  Positive for flank pain.     Physical Exam Triage Vital Signs ED Triage Vitals  Encounter Vitals Group     BP 03/18/23 1605 (!) 163/69     Systolic BP Percentile --      Diastolic BP Percentile --      Pulse Rate 03/18/23 1605 77     Resp 03/18/23 1605 18     Temp 03/18/23 1605 98.7 F (37.1 C)     Temp Source 03/18/23 1605 Oral     SpO2 03/18/23 1605 96 %     Weight --      Height --      Head Circumference --      Peak Flow --      Pain Score 03/18/23 1602 10     Pain Loc --      Pain Education --      Exclude from Growth Chart --    No data found.  Updated Vital Signs BP (!) 163/69 (BP Location: Left Arm)   Pulse 77   Temp 98.7 F (37.1 C) (Oral)   Resp 18   SpO2 96%   Visual Acuity Right Eye Distance:   Left Eye Distance:   Bilateral Distance:    Right Eye Near:   Left Eye Near:    Bilateral Near:     Physical Exam Vitals reviewed.  Constitutional:      General: She is not in acute distress.    Appearance: She is not ill-appearing, toxic-appearing or diaphoretic.  HENT:     Nose: Congestion present.     Mouth/Throat:     Mouth: Mucous  membranes are moist.     Pharynx: No oropharyngeal exudate or posterior oropharyngeal erythema.  Eyes:     Extraocular Movements: Extraocular movements intact.  Conjunctiva/sclera: Conjunctivae normal.     Pupils: Pupils are equal, round, and reactive to light.  Cardiovascular:     Rate and Rhythm: Normal rate and regular rhythm.     Heart sounds: No murmur heard. Pulmonary:     Effort: No respiratory distress.     Breath sounds: No stridor. No wheezing, rhonchi or rales.     Comments: There is some tenderness of the right posterior rib cage in the posterior axillary line, and also medial to that.  There is no rash there and no deformity. Abdominal:     Palpations: Abdomen is soft.     Tenderness: There is no abdominal tenderness.  Musculoskeletal:     Cervical back: Neck supple.  Lymphadenopathy:     Cervical: No cervical adenopathy.  Skin:    Capillary Refill: Capillary refill takes less than 2 seconds.     Coloration: Skin is not jaundiced or pale.  Neurological:     General: No focal deficit present.     Mental Status: She is alert and oriented to person, place, and time.  Psychiatric:        Behavior: Behavior normal.      UC Treatments / Results  Labs (all labs ordered are listed, but only abnormal results are displayed) Labs Reviewed  POC COVID19/FLU A&B COMBO    EKG   Radiology No results found.  Procedures Procedures (including critical care time)  Medications Ordered in UC Medications  ketorolac (TORADOL) 15 MG/ML injection 15 mg (has no administration in time range)    Initial Impression / Assessment and Plan / UC Course  I have reviewed the triage vital signs and the nursing notes.  Pertinent labs & imaging results that were available during my care of the patient were reviewed by me and considered in my medical decision making (see chart for details).     By my review there is possibly a developing pneumonia in the right hilum and right  lower lobe.  I do not see any obvious rib fractures or masses.  The patient and her daughter are advised of radiology overread.  Doxycycline is sent in to treat pneumonia and Tessalon Perles are sent in for the cough.  Toradol injection 15 mg is given here and tramadol sent to the pharmacy for pain.  COVID and flu test is negative. Final Clinical Impressions(s) / UC Diagnoses   Final diagnoses:  Acute cough  Rib pain on right side  Community acquired pneumonia of right lower lobe of lung     Discharge Instructions      The test for flu and COVID was negative.  By my review there may be a pneumonia developing in the right lower lung.  Also I do not see any fractures of the ribs.  The radiologist will also read your x-ray, and if their interpretation differs significantly from mine, and the management of your condition would change, we will call you.  You have been given a shot of Toradol 15 mg today.  Take doxycycline 100 mg --1 capsule 2 times daily for 7 days  Take benzonatate 100 mg, 1 tab every 8 hours as needed for cough.  Take tramadol 50 mg-- 1 tablet every 6 hours as needed for pain.  This medication can make you sleepy or dizzy  You can also continue to use the topical patches for pain as they gave you some relief last night.      ED Prescriptions     Medication Sig Dispense  Auth. Provider   doxycycline (VIBRAMYCIN) 100 MG capsule Take 1 capsule (100 mg total) by mouth 2 (two) times daily for 7 days. 14 capsule Saharsh Sterling, Janace Aris, MD   benzonatate (TESSALON) 100 MG capsule Take 1 capsule (100 mg total) by mouth 3 (three) times daily as needed for cough. 21 capsule Zenia Resides, MD   traMADol (ULTRAM) 50 MG tablet Take 1 tablet (50 mg total) by mouth every 6 (six) hours as needed (pain). 12 tablet Demaree Liberto, Janace Aris, MD      I have reviewed the PDMP during this encounter.   Zenia Resides, MD 03/18/23 1719    Zenia Resides, MD 03/18/23 [phone]

## 2023-03-18 NOTE — Discharge Instructions (Addendum)
 The test for flu and COVID was negative.  By my review there may be a pneumonia developing in the right lower lung.  Also I do not see any fractures of the ribs.  The radiologist will also read your x-ray, and if their interpretation differs significantly from mine, and the management of your condition would change, we will call you.  You have been given a shot of Toradol 15 mg today.  Take doxycycline 100 mg --1 capsule 2 times daily for 7 days  Take benzonatate 100 mg, 1 tab every 8 hours as needed for cough.  Take tramadol 50 mg-- 1 tablet every 6 hours as needed for pain.  This medication can make you sleepy or dizzy  You can also continue to use the topical patches for pain as they gave you some relief last night.

## 2023-03-18 NOTE — ED Triage Notes (Signed)
 Complaining of cough, right side pain.  History of cancer in left breast.    Coughing and runny nose started yesterday.  Pain in right side started last week pain is right flank and says when pain occurs, pain radiates down right leg.    Denies a fall.    Took extra strength tylenol last night

## 2023-03-26 ENCOUNTER — Encounter: Payer: Self-pay | Admitting: Hematology and Oncology

## 2023-04-07 ENCOUNTER — Ambulatory Visit (HOSPITAL_COMMUNITY)

## 2023-04-07 ENCOUNTER — Ambulatory Visit (HOSPITAL_COMMUNITY)
Admission: EM | Admit: 2023-04-07 | Discharge: 2023-04-07 | Disposition: A | Discharge status: Home / Self Care | Attending: Emergency Medicine | Admitting: Emergency Medicine

## 2023-04-07 ENCOUNTER — Other Ambulatory Visit: Payer: Self-pay

## 2023-04-07 ENCOUNTER — Ambulatory Visit (INDEPENDENT_AMBULATORY_CARE_PROVIDER_SITE_OTHER)

## 2023-04-07 ENCOUNTER — Encounter (HOSPITAL_COMMUNITY): Payer: Self-pay | Admitting: Emergency Medicine

## 2023-04-07 DIAGNOSIS — M546 Pain in thoracic spine: Secondary | ICD-10-CM

## 2023-04-07 MED ORDER — KETOROLAC TROMETHAMINE 30 MG/ML IJ SOLN: 30.0000 mg | Freq: Once | INTRAMUSCULAR | Status: DC

## 2023-04-07 MED ORDER — KETOROLAC TROMETHAMINE 30 MG/ML IJ SOLN
15.0000 mg | Freq: Once | INTRAMUSCULAR | Status: AC
  Administered 2023-04-07: 15 mg via INTRAMUSCULAR

## 2023-04-07 MED ORDER — KETOROLAC TROMETHAMINE 30 MG/ML IJ SOLN
INTRAMUSCULAR | Status: AC
  Filled 2023-04-07: qty 1

## 2023-04-07 MED ORDER — TRAMADOL HCL 50 MG PO TABS: 50.0000 mg | ORAL_TABLET | Freq: Four times a day (QID) | ORAL | 0 refills | Status: AC | PRN

## 2023-04-07 NOTE — Discharge Instructions (Addendum)
 The reports of your chest x-ray are still pending, we will notify you of those results once we receive them.  Per my personal interpretation of your chest x-ray, I do not believe that you for experiencing pneumonia at this time.  I also do not see any signs of injury or abnormal lesions on your right sided ribs.  During your visit today, you were provided with an injection of ketorolac to treat your pain.  I have also provided you with a renewal for your prescription of tramadol that you may take every 6 hours as needed for pain.  Please follow-up with your primary care provider to discuss your back pain and whether or not three-dimensional imaging, such as an MRI or a CT scan, might be indicated to evaluate for possible spread of your breast cancer to your spine.  Thank you for visiting [facility] today.

## 2023-04-07 NOTE — ED Provider Notes (Signed)
 [facility]    CSN: [id number] Arrival date & time: 04/07/23  1302    HISTORY   Chief Complaint  Patient presents with   Back Pain   HPI April Gallagher is a pleasant, 89 y.o. female who presents to urgent care today. Pt is here with daughter today who states patient c/o back pain present for the past 3 weeks, worsening since yesterday.  States she was seen here 3 weeks ago for this same back pain and was diagnosed with PNA.  States abx prescribed at that visit resolved her cough within three days.  States she was also prescribed tramadol which was very helpful in relieving her back pain.  EMR reviewed, radiology report of chest x-ray performed during the visit was negative for pneumonia.  Patient again has normal vital signs on arrival today but appears to be in a considerable amount of pain, states she is having a difficult time standing up secondary to pain.  Patient apparently has a history of breast cancer, currently being treated with fulvestrant.  Daughter states that patient has an appointment for imaging in 2 days.  The history is provided by the patient.   Past Medical History:  Diagnosis Date   Arthritis    Breast cancer (HCC) 2020   Left Breast Cancer   Diabetes mellitus without complication (HCC)    Hypertension    Patient Active Problem List   Diagnosis Date Noted   Type 2 diabetes mellitus with vascular disease (HCC) 01/24/2021   Diabetic neuropathy (HCC) 01/24/2021   Malignant neoplasm of upper-inner quadrant of left breast in female, estrogen receptor positive (HCC) 09/02/2018   Benign essential hypertension 01/29/2018   Hyperlipidemia 01/29/2018   Obesity 01/29/2018   Osteoarthritis of knee 01/29/2018   Osteoporosis 01/29/2018   Type 2 diabetes mellitus without complication (HCC) 01/29/2018   Vitamin D deficiency 01/29/2018   Abnormal gait 10/13/2014   Low back pain 07/05/2014   Secondary open-angle glaucoma of right eye, moderate stage 05/15/2014    Pseudophakia of both eyes 11/09/2013   PCO (posterior capsular opacification) 05/27/2013   Past Surgical History:  Procedure Laterality Date   BREAST BIOPSY Left 2020   BREAST EXCISIONAL BIOPSY Right    OB History   No obstetric history on file.    Home Medications    Prior to Admission medications   Medication Sig Start Date End Date Taking? Authorizing Provider  ACCU-CHEK SMARTVIEW test strip  08/06/18   [provider]  amLODipine (NORVASC) 10 MG tablet Take 10 mg by mouth daily.    [provider]  atorvastatin (LIPITOR) 40 MG tablet Take 40 mg by mouth at bedtime.     [provider]  benzonatate (TESSALON) 100 MG capsule Take 1 capsule (100 mg total) by mouth 3 (three) times daily as needed for cough. 03/18/23   Zenia Resides, MD  brimonidine (ALPHAGAN) 0.2 % ophthalmic solution INSTILL 1 DROP INTO RIGHT EYE TWICE DAILY 07/30/18   [provider]  calcium carbonate (OS-CAL) 600 MG TABS tablet Take 600 mg by mouth every morning.     [provider]  clotrimazole-betamethasone (LOTRISONE) cream clotrimazole-betamethasone 1 %-0.05 % topical cream    [provider]  Coenzyme Q10 (CO Q 10 PO) Take 1 tablet by mouth daily.    [provider]  diclofenac Sodium (VOLTAREN) 1 % GEL diclofenac 1 % topical gel    [provider]  Flaxseed, Linseed, (FLAXSEED OIL PO) Take 1 tablet by mouth  daily.     [provider]  GARLIC PO Take 1 tablet by mouth daily.     [provider]  glucosamine-chondroitin 500-400 MG tablet Take 1 tablet by mouth daily.    [provider]  glucose blood test strip Accu-Chek SmartView Test Strips  CHECK BLOOD SUGAR TWICE  DAILY    [provider]  ketorolac (TORADOL) 60 MG/2ML SOLN injection ketorolac 60 mg/2 mL intramuscular solution  Inject 60 mg IM x 1    [provider]  latanoprost (XALATAN) 0.005 % ophthalmic solution Place 1 drop into  the right eye at bedtime.     [provider]  meclizine (ANTIVERT) 25 MG tablet meclizine 25 mg tablet    [provider]  metFORMIN (GLUCOPHAGE) 500 MG tablet Take 500 mg by mouth 2 (two) times daily with a meal.     [provider]  predniSONE (DELTASONE) 5 MG tablet prednisone 5 mg tablets in a dose pack  TAKE AS DIRECTED FOR 6 DAYS    [provider]  traMADol (ULTRAM) 50 MG tablet Take 1 tablet (50 mg total) by mouth every 6 (six) hours as needed for up to 5 days (pain). 04/07/23 04/12/23  Theadora Rama Scales, PA-C    Family History History reviewed. No pertinent family history. Social History Social History   Tobacco Use   Smoking status: Never   Smokeless tobacco: Never  Vaping Use   Vaping status: Never Used  Substance Use Topics   Alcohol use: No   Drug use: Never   Allergies   Patient has no known allergies.  Review of Systems Review of Systems Pertinent findings revealed after performing a 14 point review of systems has been noted in the history of present illness.  Physical Exam Vital Signs BP 136/70 (BP Location: Right Arm)   Pulse 87   Temp 98.5 F (36.9 C) (Oral)   Resp 18   SpO2 99%   No data found.  Physical Exam Vitals reviewed.  Constitutional:      General: She is not in acute distress.    Appearance: She is not ill-appearing, toxic-appearing or diaphoretic.  HENT:     Nose: Nose normal. No congestion or rhinorrhea.     Mouth/Throat:     Mouth: Mucous membranes are moist.     Pharynx: No oropharyngeal exudate or posterior oropharyngeal erythema.  Eyes:     Extraocular Movements: Extraocular movements intact.     Conjunctiva/sclera: Conjunctivae normal.     Pupils: Pupils are equal, round, and reactive to light.  Cardiovascular:     Rate and Rhythm: Regular rhythm.     Heart sounds: No murmur heard. Pulmonary:     Effort: No accessory muscle usage, prolonged expiration or respiratory distress.      Breath sounds: Normal breath sounds and air entry. No stridor, decreased air movement or transmitted upper airway sounds. No decreased breath sounds, wheezing, rhonchi or rales.     Comments: There is some tenderness of the right posterior rib cage in the posterior axillary line, and also medial to that.  There is no rash there and no deformity. Abdominal:     Palpations: Abdomen is soft.     Tenderness: There is no abdominal tenderness.  Musculoskeletal:     Cervical back: Neck supple.  Lymphadenopathy:     Cervical: No cervical adenopathy.  Skin:    Capillary Refill: Capillary refill takes less than 2 seconds.     Coloration: Skin is not  jaundiced or pale.  Neurological:     General: No focal deficit present.     Mental Status: She is alert and oriented to person, place, and time.  Psychiatric:        Behavior: Behavior normal.     Visual Acuity Right Eye Distance:   Left Eye Distance:   Bilateral Distance:    Right Eye Near:   Left Eye Near:    Bilateral Near:     UC Couse / Diagnostics / Procedures:     Radiology DG Ribs Unilateral Right Result Date: 04/07/2023 CLINICAL DATA:  Recent diagnosis of pneumonia. Increased back pain since yesterday. EXAM: RIGHT RIBS - 2 VIEW; CHEST - 2 VIEW COMPARISON:  03/18/2023 chest and rib radiographs FINDINGS: PA and lateral views of the chest demonstrate hyperinflation. Midline trachea. Mild cardiomegaly. Atherosclerosis in the transverse aorta. Tortuous thoracic aorta. Increased density projecting over the medial right apex is likely due to prominent great vessels, relatively similar to 06/03/2009. No pleural effusion or pneumothorax. No congestive failure. Clear lungs. Thoracic spondylosis. No gross thoracic vertebral body height loss. Right rib films x3 demonstrate no displaced fracture. IMPRESSION: No acute findings about the chest or right ribs. Cardiomegaly without congestive failure. Aortic Atherosclerosis (ICD10-I70.0). Electronically  Signed   By: Jeronimo Greaves M.D.   On: 04/07/2023 16:34   DG Chest 2 View Result Date: 04/07/2023 CLINICAL DATA:  Recent diagnosis of pneumonia. Increased back pain since yesterday. EXAM: RIGHT RIBS - 2 VIEW; CHEST - 2 VIEW COMPARISON:  03/18/2023 chest and rib radiographs FINDINGS: PA and lateral views of the chest demonstrate hyperinflation. Midline trachea. Mild cardiomegaly. Atherosclerosis in the transverse aorta. Tortuous thoracic aorta. Increased density projecting over the medial right apex is likely due to prominent great vessels, relatively similar to 06/03/2009. No pleural effusion or pneumothorax. No congestive failure. Clear lungs. Thoracic spondylosis. No gross thoracic vertebral body height loss. Right rib films x3 demonstrate no displaced fracture. IMPRESSION: No acute findings about the chest or right ribs. Cardiomegaly without congestive failure. Aortic Atherosclerosis (ICD10-I70.0). Electronically Signed   By: Jeronimo Greaves M.D.   On: 04/07/2023 16:34    Procedures Procedures (including critical care time) EKG  Pending results:  Labs Reviewed - No data to display  Medications Ordered in UC: Medications  ketorolac (TORADOL) 30 MG/ML injection 15 mg (15 mg Intramuscular Given 04/07/23 1536)    UC Diagnoses / Final Clinical Impressions(s)   I have reviewed the triage vital signs and the nursing notes.  Pertinent labs & imaging results that were available during my care of the patient were reviewed by me and considered in my medical decision making (see chart for details).    Final diagnoses:  Right-sided thoracic back pain, unspecified chronicity   Patient provided with a copy of her x-ray reports.  Patient provided with ketorolac injection for immediate pain relief.  Recommend continued use of tramadol and follow-up with PCP to discuss 3D imaging to evaluate for possible mets to bone which may not be apparent on x-ray.  Please see discharge instructions below for details of  plan of care as provided to patient. ED Prescriptions     Medication Sig Dispense Auth. Provider   traMADol (ULTRAM) 50 MG tablet Take 1 tablet (50 mg total) by mouth every 6 (six) hours as needed for up to 5 days (pain). 20 tablet Theadora Rama Scales, PA-C      I have reviewed the PDMP during this encounter.  Pending results:  Labs Reviewed - No  data to display    Discharge Instructions      The reports of your chest x-ray are still pending, we will notify you of those results once we receive them.  Per my personal interpretation of your chest x-ray, I do not believe that you for experiencing pneumonia at this time.  I also do not see any signs of injury or abnormal lesions on your right sided ribs.  During your visit today, you were provided with an injection of ketorolac to treat your pain.  I have also provided you with a renewal for your prescription of tramadol that you may take every 6 hours as needed for pain.  Please follow-up with your primary care provider to discuss your back pain and whether or not three-dimensional imaging, such as an MRI or a CT scan, might be indicated to evaluate for possible spread of your breast cancer to your spine.  Thank you for visiting [facility] today.      Disposition Upon Discharge:  Condition: stable for discharge home  Patient presented with an acute illness with associated systemic symptoms and significant discomfort requiring urgent management. In my opinion, this is a condition that a prudent lay person (someone who possesses an average knowledge of health and medicine) may potentially expect to result in complications if not addressed urgently such as respiratory distress, impairment of bodily function or dysfunction of bodily organs.   Routine symptom specific, illness specific and/or disease specific instructions were discussed with the patient and/or caregiver at length.   As such, the patient has been evaluated  and assessed, work-up was performed and treatment was provided in alignment with urgent care protocols and evidence based medicine.  Patient/parent/caregiver has been advised that the patient may require follow up for further testing and treatment if the symptoms continue in spite of treatment, as clinically indicated and appropriate.  Patient/parent/caregiver has been advised to return to the [facility] or PCP if no better; to PCP or the Emergency Department if new signs and symptoms develop, or if the current signs or symptoms continue to change or worsen for further workup, evaluation and treatment as clinically indicated and appropriate  The patient will follow up with their current PCP if and as advised. If the patient does not currently have a PCP we will assist them in obtaining one.   The patient may need specialty follow up if the symptoms continue, in spite of conservative treatment and management, for further workup, evaluation, consultation and treatment as clinically indicated and appropriate.  Patient/parent/caregiver verbalized understanding and agreement of plan as discussed.  All questions were addressed during visit.  Please see discharge instructions below for further details of plan.  This office note has been dictated using Teaching laboratory technician.  Unfortunately, this method of dictation can sometimes lead to typographical or grammatical errors.  I apologize for your inconvenience in advance if this occurs.  Please do not hesitate to reach out to me if clarification is needed.      Theadora Rama Scales, PA-C 04/08/23 1734

## 2023-04-07 NOTE — ED Triage Notes (Signed)
 Increase back pain since yesterday seen here last week and diagnosed with PNA.

## 2023-04-09 ENCOUNTER — Other Ambulatory Visit: Payer: Self-pay | Admitting: Hematology and Oncology

## 2023-04-09 ENCOUNTER — Ambulatory Visit
Admission: RE | Admit: 2023-04-09 | Discharge: 2023-04-09 | Disposition: A | Discharge status: Home / Self Care | Source: Ambulatory Visit | Attending: Hematology and Oncology

## 2023-04-09 ENCOUNTER — Other Ambulatory Visit: Payer: Medicare Other

## 2023-04-09 ENCOUNTER — Ambulatory Visit: Payer: Medicare Other | Admitting: Hematology and Oncology

## 2023-04-09 ENCOUNTER — Inpatient Hospital Stay: Payer: Medicare Other | Attending: Hematology and Oncology

## 2023-04-09 DIAGNOSIS — C50212 Malignant neoplasm of upper-inner quadrant of left female breast: Secondary | ICD-10-CM

## 2023-04-09 DIAGNOSIS — Z170 Estrogen receptor positive status [ER+]: Secondary | ICD-10-CM | POA: Diagnosis not present

## 2023-04-09 DIAGNOSIS — Z1722 Progesterone receptor negative status: Secondary | ICD-10-CM | POA: Insufficient documentation

## 2023-04-09 DIAGNOSIS — Z5111 Encounter for antineoplastic chemotherapy: Secondary | ICD-10-CM | POA: Diagnosis present

## 2023-04-09 DIAGNOSIS — Z1732 Human epidermal growth factor receptor 2 negative status: Secondary | ICD-10-CM | POA: Insufficient documentation

## 2023-04-09 MED ORDER — FULVESTRANT 250 MG/5ML IM SOSY
500.0000 mg | PREFILLED_SYRINGE | Freq: Once | INTRAMUSCULAR | Status: AC
  Administered 2023-04-09: 500 mg via INTRAMUSCULAR
  Filled 2023-04-09: qty 10

## 2023-04-10 ENCOUNTER — Other Ambulatory Visit: Payer: Self-pay | Admitting: *Deleted

## 2023-04-10 DIAGNOSIS — C50212 Malignant neoplasm of upper-inner quadrant of left female breast: Secondary | ICD-10-CM

## 2023-04-10 NOTE — Progress Notes (Signed)
 Received message from infusion team stating pt has complaint of severe bone pain interfering with ADL's. Verbal orders received and placed per MD request to obtain bone scan. Appt scheduled, pt daughter notified and verbalized understanding.

## 2023-04-16 ENCOUNTER — Ambulatory Visit: Admitting: Podiatry

## 2023-04-17 NOTE — Progress Notes (Signed)
 Shoes and insert previously cancelled asa fax number for Dr. Renae Gloss was wrong on [employer] website new fax obtained and shoes and custom inserts on order again once ppw recvd from treating Dr. Vivia Ewing and inserts will be shipped

## 2023-04-18 ENCOUNTER — Ambulatory Visit (HOSPITAL_COMMUNITY)
Admission: RE | Admit: 2023-04-18 | Discharge: 2023-04-18 | Disposition: A | Discharge status: Home / Self Care | Source: Ambulatory Visit | Attending: Hematology and Oncology | Admitting: Hematology and Oncology

## 2023-04-18 ENCOUNTER — Other Ambulatory Visit: Payer: Self-pay | Admitting: Hematology and Oncology

## 2023-04-18 DIAGNOSIS — C50212 Malignant neoplasm of upper-inner quadrant of left female breast: Secondary | ICD-10-CM | POA: Insufficient documentation

## 2023-04-18 DIAGNOSIS — Z170 Estrogen receptor positive status [ER+]: Secondary | ICD-10-CM | POA: Insufficient documentation

## 2023-04-18 MED ORDER — TECHNETIUM TC 99M MEDRONATE IV KIT
20.0000 | PACK | Freq: Once | INTRAVENOUS | Status: AC | PRN
  Administered 2023-04-18: 18.6 via INTRAVENOUS

## 2023-04-18 MED ORDER — OXYCODONE-ACETAMINOPHEN 5-325 MG PO TABS: 1.0000 | ORAL_TABLET | Freq: Four times a day (QID) | ORAL | 0 refills | Status: DC | PRN

## 2023-04-18 NOTE — Progress Notes (Signed)
 Severe intractable back pain: Bone scan has been obtained today.  Will send a prescription for Percocet because Ultram is not working anymore.

## 2023-04-22 ENCOUNTER — Inpatient Hospital Stay: Attending: Hematology and Oncology | Admitting: Hematology and Oncology

## 2023-04-22 ENCOUNTER — Inpatient Hospital Stay: Attending: Hematology and Oncology

## 2023-04-22 ENCOUNTER — Ambulatory Visit (HOSPITAL_COMMUNITY)
Admission: RE | Admit: 2023-04-22 | Discharge: 2023-04-22 | Disposition: A | Discharge status: Home / Self Care | Source: Ambulatory Visit | Attending: Hematology and Oncology | Admitting: Hematology and Oncology

## 2023-04-22 ENCOUNTER — Other Ambulatory Visit: Payer: Self-pay

## 2023-04-22 VITALS — BP 132/55 | HR 90 | Temp 97.6°F | Resp 18 | Ht 62.0 in | Wt 157.5 lb

## 2023-04-22 DIAGNOSIS — Z170 Estrogen receptor positive status [ER+]: Secondary | ICD-10-CM

## 2023-04-22 DIAGNOSIS — Y939 Activity, unspecified: Secondary | ICD-10-CM | POA: Insufficient documentation

## 2023-04-22 DIAGNOSIS — C50212 Malignant neoplasm of upper-inner quadrant of left female breast: Secondary | ICD-10-CM | POA: Diagnosis not present

## 2023-04-22 DIAGNOSIS — M545 Low back pain, unspecified: Secondary | ICD-10-CM | POA: Diagnosis present

## 2023-04-22 DIAGNOSIS — Z1732 Human epidermal growth factor receptor 2 negative status: Secondary | ICD-10-CM | POA: Insufficient documentation

## 2023-04-22 DIAGNOSIS — M546 Pain in thoracic spine: Secondary | ICD-10-CM | POA: Insufficient documentation

## 2023-04-22 DIAGNOSIS — S22089A Unspecified fracture of T11-T12 vertebra, initial encounter for closed fracture: Secondary | ICD-10-CM | POA: Insufficient documentation

## 2023-04-22 DIAGNOSIS — Z79818 Long term (current) use of other agents affecting estrogen receptors and estrogen levels: Secondary | ICD-10-CM | POA: Insufficient documentation

## 2023-04-22 DIAGNOSIS — Z1722 Progesterone receptor negative status: Secondary | ICD-10-CM | POA: Insufficient documentation

## 2023-04-22 LAB — CMP (CANCER CENTER ONLY)
ALT: 8 U/L (ref 0–44)
AST: 13 U/L — ABNORMAL LOW (ref 15–41)
Albumin: 4.2 g/dL (ref 3.5–5.0)
Alkaline Phosphatase: 55 U/L (ref 38–126)
Anion gap: 5 (ref 5–15)
BUN: 11 mg/dL (ref 8–23)
CO2: 29 mmol/L (ref 22–32)
Calcium: 9.9 mg/dL (ref 8.9–10.3)
Chloride: 103 mmol/L (ref 98–111)
Creatinine: 0.58 mg/dL (ref 0.44–1.00)
GFR, Estimated: >60 mL/min (ref >60)
Glucose, Bld: 99 mg/dL (ref 70–99)
Potassium: 4.2 mmol/L (ref 3.5–5.1)
Sodium: 137 mmol/L (ref 135–145)
Total Bilirubin: 0.4 mg/dL (ref 0.0–1.2)
Total Protein: 7.8 g/dL (ref 6.5–8.1)

## 2023-04-22 LAB — CBC WITH DIFFERENTIAL (CANCER CENTER ONLY)
Abs Immature Granulocytes: 0.01 10*3/uL (ref 0.00–0.07)
Basophils Absolute: 0 10*3/uL (ref 0.0–0.1)
Basophils Relative: 0 %
Eosinophils Absolute: 0.1 10*3/uL (ref 0.0–0.5)
Eosinophils Relative: 2 %
HCT: 31.1 % — ABNORMAL LOW (ref 36.0–46.0)
Hemoglobin: 10.1 g/dL — ABNORMAL LOW (ref 12.0–15.0)
Immature Granulocytes: 0 %
Lymphocytes Relative: 29 %
Lymphs Abs: 1.3 10*3/uL (ref 0.7–4.0)
MCH: 30.1 pg (ref 26.0–34.0)
MCHC: 32.5 g/dL (ref 30.0–36.0)
MCV: 92.6 fL (ref 80.0–100.0)
Monocytes Absolute: 0.3 10*3/uL (ref 0.1–1.0)
Monocytes Relative: 7 %
Neutro Abs: 2.9 10*3/uL (ref 1.7–7.7)
Neutrophils Relative %: 62 %
Platelet Count: 219 10*3/uL (ref 150–400)
RBC: 3.36 MIL/uL — ABNORMAL LOW (ref 3.87–5.11)
RDW: 12.4 % (ref 11.5–15.5)
WBC Count: 4.7 10*3/uL (ref 4.0–10.5)
nRBC: 0 % (ref 0.0–0.2)

## 2023-04-22 MED ORDER — GADOBUTROL 1 MMOL/ML IV SOLN
7.0000 mL | Freq: Once | INTRAVENOUS | Status: AC | PRN
  Administered 2023-04-22: 7 mL via INTRAVENOUS

## 2023-04-22 NOTE — Assessment & Plan Note (Signed)
 08/26/2018:Palpable mass 4.2cm in the upper inner left breast at the 11 o'clock position with 4 enlarged axillary lymph nodes. Biopsy on 08/26/18 showed invasive lobular carcinoma, grade 1-2, HER-2 negative (1+), ER 100%, PR 0%, Ki67 15% in the left breast and axilla. T2N1 stage IIa   CT scans showed the breast cancer and the lymphadenopathy in the axilla and the subpectoral areas.  In addition to this she had a left kidney mass measuring 4.5 x 3.5 cm.  Could be a primary renal cell cancer.  But given her age and health issues no surgical approaches are planned.   Current treatment: Palliative treatment with anastrozole 1 mg daily discontinued 08/10/2021 switched to Faslodex August 2023   Breast cancer surveillance: 1. Left breast mammogram and ultrasound 05/15/2022: 3.6 cm left breast mass with left axillary lymph nodes (stable disease) 2. Breast exam 02/04/2023: Benign  Intractable back pain: Bone scan 04/19/2023: Indeterminate radiotracer uptake in lower thoracic and upper lumbar vertebra.  Thoracic MRI recommended.  Pain control: Currently on Percocets. Follow-up after the MRI to discuss results.

## 2023-04-22 NOTE — Progress Notes (Signed)
 Patient Care Team: Andi Devon, MD as PCP - General (Internal Medicine)  DIAGNOSIS:  Encounter Diagnosis  Name Primary?   Malignant neoplasm of upper-inner quadrant of left breast in female, estrogen receptor positive (HCC) Yes    SUMMARY OF ONCOLOGIC HISTORY: Oncology History  Malignant neoplasm of upper-inner quadrant of left breast in female, estrogen receptor positive (HCC)  08/26/2018 Initial Diagnosis   Palpable mass 4.2cm in the upper inner left breast at the 11 o'clock position with 4 enlarged axillary lymph nodes. Biopsy on 08/26/18 showed invasive mammary carcinoma, grade 1, HER-2 negative (1+), ER 100%, PR 0%, Ki67 15% in the left breast and axilla.   08/26/2018 Cancer Staging   Staging form: Breast, AJCC 8th Edition - Clinical stage from 08/26/2018: Stage IIB (cT2, cN1, cM0, G1, ER+, PR-, HER2-) - Signed by Loa Socks, NP on 09/03/2018   09/02/2018 -  Anti-estrogen oral therapy   Palliative anti-estrogen therapy with anastrozole      CHIEF COMPLIANT: Follow-up after recent bone scan, complaining of worsening back pain  HISTORY OF PRESENT ILLNESS:  History of Present Illness The patient, with a history of pneumonia, presents with mid back pain that started after a bout of pneumonia. The pain is described as a band around the waist. The pain is worse with movement and is particularly severe when getting up from a seated position. The patient has been using a heating pad and a Administrator, sports for relief. The patient has been taking Percocet twice daily, at noon and at 8pm, for pain management. The patient reports that the Percocet is more effective than Tramadol. The patient denies any other symptoms.     ALLERGIES:  has no known allergies.  MEDICATIONS:  Current Outpatient Medications  Medication Sig Dispense Refill   ACCU-CHEK SMARTVIEW test strip      amLODipine (NORVASC) 10 MG tablet Take 10 mg by mouth daily.     atorvastatin (LIPITOR) 40 MG  tablet Take 40 mg by mouth at bedtime.      benzonatate (TESSALON) 100 MG capsule Take 1 capsule (100 mg total) by mouth 3 (three) times daily as needed for cough. 21 capsule 0   brimonidine (ALPHAGAN) 0.2 % ophthalmic solution INSTILL 1 DROP INTO RIGHT EYE TWICE DAILY     calcium carbonate (OS-CAL) 600 MG TABS tablet Take 600 mg by mouth every morning.      clotrimazole-betamethasone (LOTRISONE) cream clotrimazole-betamethasone 1 %-0.05 % topical cream     Coenzyme Q10 (CO Q 10 PO) Take 1 tablet by mouth daily.     diclofenac Sodium (VOLTAREN) 1 % GEL diclofenac 1 % topical gel     Flaxseed, Linseed, (FLAXSEED OIL PO) Take 1 tablet by mouth daily.      GARLIC PO Take 1 tablet by mouth daily.      glucosamine-chondroitin 500-400 MG tablet Take 1 tablet by mouth daily.     glucose blood test strip Accu-Chek SmartView Test Strips  CHECK BLOOD SUGAR TWICE  DAILY     ketorolac (TORADOL) 60 MG/2ML SOLN injection ketorolac 60 mg/2 mL intramuscular solution  Inject 60 mg IM x 1     latanoprost (XALATAN) 0.005 % ophthalmic solution Place 1 drop into the right eye at bedtime.      meclizine (ANTIVERT) 25 MG tablet meclizine 25 mg tablet     metFORMIN (GLUCOPHAGE) 500 MG tablet Take 500 mg by mouth 2 (two) times daily with a meal.      oxyCODONE-acetaminophen (PERCOCET/ROXICET) 5-325 MG tablet  Take 1 tablet by mouth every 6 (six) hours as needed for severe pain (pain score 7-10). 30 tablet 0   predniSONE (DELTASONE) 5 MG tablet prednisone 5 mg tablets in a dose pack  TAKE AS DIRECTED FOR 6 DAYS     No current facility-administered medications for this visit.    PHYSICAL EXAMINATION: ECOG PERFORMANCE STATUS: 1 - Symptomatic but completely ambulatory  Vitals:   04/22/23 1311  BP: (!) 132/55  Pulse: 90  Resp: 18  Temp: 97.6 F (36.4 C)  SpO2: 98%   Filed Weights   04/22/23 1311  Weight: 157 lb 8 oz (71.4 kg)   LABORATORY DATA:  I have reviewed the data as listed    Latest Ref Rng &  Units 03/12/2023    1:42 PM 02/12/2023    2:25 PM 12/13/2021    2:46 PM  CMP  Glucose 70 - 99 mg/dL 782  956  213   BUN 8 - 23 mg/dL 16  13  14    Creatinine 0.44 - 1.00 mg/dL 0.86  5.78  4.69   Sodium 135 - 145 mmol/L 140  141  138   Potassium 3.5 - 5.1 mmol/L 4.8  4.1  4.2   Chloride 98 - 111 mmol/L 106  106  105   CO2 22 - 32 mmol/L 29  29  27    Calcium 8.9 - 10.3 mg/dL 62.9  52.8  9.6   Total Protein 6.5 - 8.1 g/dL 7.2  7.3  6.8   Total Bilirubin 0.0 - 1.2 mg/dL 0.4  0.5  0.6   Alkaline Phos 38 - 126 U/L 47  49  48   AST 15 - 41 U/L 13  12  14    ALT 0 - 44 U/L 8  9  11      Lab Results  Component Value Date   WBC 4.1 03/12/2023   HGB 10.4 (L) 03/12/2023   HCT 32.7 (L) 03/12/2023   MCV 94.5 03/12/2023   PLT 150 03/12/2023   NEUTROABS 2.3 03/12/2023    ASSESSMENT & PLAN:  Malignant neoplasm of upper-inner quadrant of left breast in female, estrogen receptor positive (HCC) 08/26/2018:Palpable mass 4.2cm in the upper inner left breast at the 11 o'clock position with 4 enlarged axillary lymph nodes. Biopsy on 08/26/18 showed invasive lobular carcinoma, grade 1-2, HER-2 negative (1+), ER 100%, PR 0%, Ki67 15% in the left breast and axilla. T2N1 stage IIa   CT scans showed the breast cancer and the lymphadenopathy in the axilla and the subpectoral areas.  In addition to this she had a left kidney mass measuring 4.5 x 3.5 cm.  Could be a primary renal cell cancer.  But given her age and health issues no surgical approaches are planned.   Current treatment: Palliative treatment with anastrozole 1 mg daily discontinued 08/10/2021 switched to Faslodex August 2023   Breast cancer surveillance: 1. Left breast mammogram and ultrasound 05/15/2022: 3.6 cm left breast mass with left axillary lymph nodes (stable disease) 2. Breast exam 02/04/2023: Benign  Intractable back pain:  Bone scan 04/19/2023: Indeterminate radiotracer uptake in lower thoracic and upper lumbar vertebra. Concern for cord  compression: I will obtain a thoracic spine MRI with and without contrast for further evaluation stat.  Pain control: Currently on Percocets. Telephone follow-up after the MRI to discuss results.    No orders of the defined types were placed in this encounter.  The patient has a good understanding of the overall plan. she agrees with it.  she will call with any problems that may develop before the next visit here. Total time spent: 30 mins including face to face time and time spent for planning, charting and co-ordination of care   Tamsen Meek, MD 04/22/23

## 2023-04-30 ENCOUNTER — Telehealth: Admitting: Hematology and Oncology

## 2023-05-03 ENCOUNTER — Telehealth: Payer: Self-pay

## 2023-05-03 ENCOUNTER — Other Ambulatory Visit: Payer: Self-pay | Admitting: Hematology and Oncology

## 2023-05-03 MED ORDER — OXYCODONE-ACETAMINOPHEN 5-325 MG PO TABS: 1.0000 | ORAL_TABLET | Freq: Three times a day (TID) | ORAL | 0 refills | Status: DC | PRN

## 2023-05-03 NOTE — Progress Notes (Signed)
 Oxycodone  filled on behalf of Dr Lee Public, discussed with Dr Gudena and it was noted to be appropriate for refill.  April Gallagher

## 2023-05-03 NOTE — Telephone Encounter (Signed)
 Pt's daughter called to request refill for Percocet 5-325 mg. She states she does not have enough to make it through the weekend.Last filled 04/18/23. Routed to Dr Arno Bibles as Dr Lee Public is out of office.

## 2023-05-06 ENCOUNTER — Other Ambulatory Visit: Payer: Self-pay

## 2023-05-06 ENCOUNTER — Inpatient Hospital Stay (HOSPITAL_COMMUNITY)
Admission: EM | Admit: 2023-05-06 | Discharge: 2023-05-09 | DRG: 543 | Disposition: A | Discharge status: Home Health | Attending: Internal Medicine | Admitting: Internal Medicine

## 2023-05-06 ENCOUNTER — Telehealth: Payer: Self-pay

## 2023-05-06 ENCOUNTER — Encounter (HOSPITAL_COMMUNITY): Payer: Self-pay | Admitting: Emergency Medicine

## 2023-05-06 DIAGNOSIS — E1140 Type 2 diabetes mellitus with diabetic neuropathy, unspecified: Secondary | ICD-10-CM | POA: Diagnosis present

## 2023-05-06 DIAGNOSIS — C50212 Malignant neoplasm of upper-inner quadrant of left female breast: Secondary | ICD-10-CM | POA: Diagnosis present

## 2023-05-06 DIAGNOSIS — H409 Unspecified glaucoma: Secondary | ICD-10-CM | POA: Diagnosis present

## 2023-05-06 DIAGNOSIS — D630 Anemia in neoplastic disease: Secondary | ICD-10-CM | POA: Diagnosis present

## 2023-05-06 DIAGNOSIS — M549 Dorsalgia, unspecified: Secondary | ICD-10-CM | POA: Diagnosis present

## 2023-05-06 DIAGNOSIS — E785 Hyperlipidemia, unspecified: Secondary | ICD-10-CM | POA: Diagnosis present

## 2023-05-06 DIAGNOSIS — D649 Anemia, unspecified: Secondary | ICD-10-CM | POA: Diagnosis present

## 2023-05-06 DIAGNOSIS — M810 Age-related osteoporosis without current pathological fracture: Secondary | ICD-10-CM | POA: Diagnosis present

## 2023-05-06 DIAGNOSIS — Z853 Personal history of malignant neoplasm of breast: Secondary | ICD-10-CM

## 2023-05-06 DIAGNOSIS — Z79899 Other long term (current) drug therapy: Secondary | ICD-10-CM

## 2023-05-06 DIAGNOSIS — E876 Hypokalemia: Secondary | ICD-10-CM | POA: Diagnosis present

## 2023-05-06 DIAGNOSIS — I10 Essential (primary) hypertension: Secondary | ICD-10-CM | POA: Diagnosis present

## 2023-05-06 DIAGNOSIS — S22089A Unspecified fracture of T11-T12 vertebra, initial encounter for closed fracture: Principal | ICD-10-CM

## 2023-05-06 DIAGNOSIS — M8458XA Pathological fracture in neoplastic disease, other specified site, initial encounter for fracture: Secondary | ICD-10-CM | POA: Diagnosis not present

## 2023-05-06 DIAGNOSIS — Z7982 Long term (current) use of aspirin: Secondary | ICD-10-CM

## 2023-05-06 DIAGNOSIS — Z7984 Long term (current) use of oral hypoglycemic drugs: Secondary | ICD-10-CM

## 2023-05-06 DIAGNOSIS — Z170 Estrogen receptor positive status [ER+]: Secondary | ICD-10-CM

## 2023-05-06 DIAGNOSIS — E871 Hypo-osmolality and hyponatremia: Secondary | ICD-10-CM | POA: Diagnosis present

## 2023-05-06 DIAGNOSIS — C7951 Secondary malignant neoplasm of bone: Secondary | ICD-10-CM | POA: Diagnosis present

## 2023-05-06 DIAGNOSIS — S22080A Wedge compression fracture of T11-T12 vertebra, initial encounter for closed fracture: Secondary | ICD-10-CM | POA: Diagnosis present

## 2023-05-06 LAB — COMPREHENSIVE METABOLIC PANEL WITH GFR
ALT: 9 U/L (ref 0–44)
AST: 15 U/L (ref 15–41)
Albumin: 3.9 g/dL (ref 3.5–5.0)
Alkaline Phosphatase: 49 U/L (ref 38–126)
Anion gap: 10 (ref 5–15)
BUN: 14 mg/dL (ref 8–23)
CO2: 24 mmol/L (ref 22–32)
Calcium: 9.8 mg/dL (ref 8.9–10.3)
Chloride: 100 mmol/L (ref 98–111)
Creatinine, Ser: 0.58 mg/dL (ref 0.44–1.00)
GFR, Estimated: >60 mL/min (ref >60)
Glucose, Bld: 95 mg/dL (ref 70–99)
Potassium: 4.2 mmol/L (ref 3.5–5.1)
Sodium: 134 mmol/L — ABNORMAL LOW (ref 135–145)
Total Bilirubin: 0.7 mg/dL (ref 0.0–1.2)
Total Protein: 7.4 g/dL (ref 6.5–8.1)

## 2023-05-06 LAB — CBC WITH DIFFERENTIAL/PLATELET
Abs Immature Granulocytes: 0.01 10*3/uL (ref 0.00–0.07)
Basophils Absolute: 0 10*3/uL (ref 0.0–0.1)
Basophils Relative: 0 %
Eosinophils Absolute: 0.1 10*3/uL (ref 0.0–0.5)
Eosinophils Relative: 2 %
HCT: 31.4 % — ABNORMAL LOW (ref 36.0–46.0)
Hemoglobin: 9.7 g/dL — ABNORMAL LOW (ref 12.0–15.0)
Immature Granulocytes: 0 %
Lymphocytes Relative: 26 %
Lymphs Abs: 1.2 10*3/uL (ref 0.7–4.0)
MCH: 29.7 pg (ref 26.0–34.0)
MCHC: 30.9 g/dL (ref 30.0–36.0)
MCV: 96 fL (ref 80.0–100.0)
Monocytes Absolute: 0.5 10*3/uL (ref 0.1–1.0)
Monocytes Relative: 10 %
Neutro Abs: 2.8 10*3/uL (ref 1.7–7.7)
Neutrophils Relative %: 62 %
Platelets: 185 10*3/uL (ref 150–400)
RBC: 3.27 MIL/uL — ABNORMAL LOW (ref 3.87–5.11)
RDW: 12.7 % (ref 11.5–15.5)
WBC: 4.5 10*3/uL (ref 4.0–10.5)
nRBC: 0 % (ref 0.0–0.2)

## 2023-05-06 LAB — URINALYSIS, ROUTINE W REFLEX MICROSCOPIC
Bilirubin Urine: NEGATIVE
Glucose, UA: NEGATIVE mg/dL
Hgb urine dipstick: NEGATIVE
Ketones, ur: 5 mg/dL — AB
Leukocytes,Ua: NEGATIVE
Nitrite: NEGATIVE
Protein, ur: NEGATIVE mg/dL
Specific Gravity, Urine: 1.01 (ref 1.005–1.030)
pH: 6 (ref 5.0–8.0)

## 2023-05-06 MED ORDER — ACETAMINOPHEN 500 MG PO TABS
1000.0000 mg | ORAL_TABLET | Freq: Once | ORAL | Status: AC
  Administered 2023-05-07: 1000 mg via ORAL
  Filled 2023-05-06: qty 2

## 2023-05-06 MED ORDER — LIDOCAINE 5 % EX PTCH
1.0000 | MEDICATED_PATCH | CUTANEOUS | Status: DC
  Administered 2023-05-07: 1 via TRANSDERMAL
  Filled 2023-05-06: qty 1

## 2023-05-06 MED ORDER — MORPHINE SULFATE (PF) 4 MG/ML IV SOLN
4.0000 mg | Freq: Once | INTRAVENOUS | Status: AC
  Administered 2023-05-06: 4 mg via INTRAVENOUS
  Filled 2023-05-06: qty 1

## 2023-05-06 NOTE — ED Provider Triage Note (Signed)
 Emergency Medicine Provider Triage Evaluation Note  April Gallagher , a 89 y.o. female  was evaluated in triage.  Pt complains of intermittent back pain started 2 weeks ago had MRI done 4/7 of thoracic spine noted to have a compression fracture at T11 with possible metastatic disease. Notes that she has not been able to get out of bed today. Taking Oxycodone  at home.   Denies fevers, numbness/tingling, fecal/urine incontinence, saddle paresthesias, falls, dysuria.   Review of Systems  Positive: N/a Negative: N/a  Physical Exam  BP (!) 147/79 (BP Location: Right Arm)   Pulse 83   Temp 98.9 F (37.2 C) (Oral)   Resp 18   SpO2 96%  Gen:   Awake, no distress   Resp:  Normal effort  MSK:   Moves extremities without difficulty  Other:    Medical Decision Making  Medically screening exam initiated at 3:12 PM.  Appropriate orders placed.  April Gallagher was informed that the remainder of the evaluation will be completed by another provider, this initial triage assessment does not replace that evaluation, and the importance of remaining in the ED until their evaluation is complete.     Hayes Lipps, [place] 05/06/23 [phone]

## 2023-05-06 NOTE — ED Provider Notes (Signed)
 [place] EMERGENCY DEPARTMENT AT [facility] Provider Note   CSN: [id number] Arrival date & time: 05/06/23  1414     History {Add pertinent medical, surgical, social history, OB history to HPI:1} Chief Complaint  Patient presents with   Back Pain    April Gallagher is a 89 y.o. female with PMH as listed below who presents with back pain. Patient has h/o metastatic breast cancer. Accompanied by her daughter who provides additional history.   Pt complains of intermittent back pain started 2 weeks ago. Had MRI done 4/7 of thoracic spine noted to have a compression fracture at T11 with possible metastatic disease. Was prescribed oxycodone  at home which isn't helping. Notes that she has not been able to get out of bed today due to 10/10 pain, and it radiates down her right leg. Denies numbness/tingling, lower extremity weakness, new incontinence, saddle anesthesia. Denies fevers, numbness/tingling, fecal/urine incontinence, saddle paresthesias, falls, dysuria.   Past Medical History:  Diagnosis Date   Arthritis    Breast cancer (HCC) 2020   Left Breast Cancer   Diabetes mellitus without complication (HCC)    Hypertension        Home Medications Prior to Admission medications   Medication Sig Start Date End Date Taking? Authorizing Provider  ACCU-CHEK SMARTVIEW test strip  08/06/18   [provider]  amLODipine  (NORVASC ) 10 MG tablet Take 10 mg by mouth daily.    [provider]  atorvastatin  (LIPITOR) 40 MG tablet Take 40 mg by mouth at bedtime.     [provider]  benzonatate  (TESSALON ) 100 MG capsule Take 1 capsule (100 mg total) by mouth 3 (three) times daily as needed for cough. 03/18/23   Banister, Pamela K, MD  brimonidine  (ALPHAGAN ) 0.2 % ophthalmic solution INSTILL 1 DROP INTO RIGHT EYE TWICE DAILY 07/30/18   [provider]  calcium  carbonate (OS-CAL) 600 MG TABS tablet Take 600 mg by mouth every morning.     [provider]  clotrimazole-betamethasone (LOTRISONE) cream clotrimazole-betamethasone 1 %-0.05 % topical cream    [provider]  Coenzyme Q10 (CO Q 10 PO) Take 1 tablet by mouth daily.    [provider]  diclofenac Sodium (VOLTAREN) 1 % GEL diclofenac 1 % topical gel    [provider]  Flaxseed, Linseed, (FLAXSEED OIL PO) Take 1 tablet by mouth daily.     [provider]  GARLIC PO Take 1 tablet by mouth daily.     [provider]  glucosamine-chondroitin 500-400 MG tablet Take 1 tablet by mouth daily.    [provider]  glucose blood test strip Accu-Chek SmartView Test Strips  CHECK BLOOD SUGAR TWICE  DAILY    [provider]  ketorolac  (TORADOL ) 60 MG/2ML SOLN injection ketorolac  60 mg/2 mL intramuscular solution  Inject 60 mg IM x 1    [provider]  latanoprost  (XALATAN ) 0.005 % ophthalmic solution Place 1 drop into the right eye at bedtime.     [provider]  meclizine (ANTIVERT) 25 MG tablet meclizine 25 mg tablet    [provider]  metFORMIN  (GLUCOPHAGE ) 500 MG tablet Take 500 mg by mouth 2 (two) times daily with a meal.     [provider]  oxyCODONE -acetaminophen  (PERCOCET/ROXICET) 5-325 MG tablet Take 1 tablet by mouth every 8 (eight) hours as needed for severe pain (pain score 7-10). 05/03/23   Iruku, Praveena, MD  predniSONE (DELTASONE) 5 MG tablet prednisone 5 mg tablets in  a dose pack  TAKE AS DIRECTED FOR 6 DAYS    [provider]      Allergies    Patient has no known allergies.    Review of Systems   Review of Systems A 10 point review of systems was performed and is negative unless otherwise reported in HPI.  Physical Exam Updated Vital Signs BP (!) 178/79   Pulse 99   Temp 98.7 F (37.1 C) (Oral)   Resp 16   SpO2 95%  Physical Exam General: Normal appearing female, lying in bed.  HEENT: PERRLA, Sclera anicteric, MMM, trachea midline.  Cardiology: RRR, no  murmurs/rubs/gallops. BL radial and DP pulses equal bilaterally.  Resp: Normal respiratory rate and effort. CTAB, no wheezes, rhonchi, crackles.  Abd: Soft, non-tender, non-distended. No rebound tenderness or guarding.  Back: TTP midline of lower thoracic/upper lumbar spine. +striaght leg raise on the right.  MSK: 3+ pitting edema noted in RLE, worse than 1+ pitting edema in LLE. Mild TTP of right calf. No peripheral edema or signs of trauma. Extremities without deformity or TTP. No cyanosis or clubbing. Skin: warm, dry. Neuro: A&Ox4, CNs II-XII grossly intact. MAEs. Sensation grossly intact.  Psych: Normal mood and affect.   ED Results / Procedures / Treatments   Labs (all labs ordered are listed, but only abnormal results are displayed) Labs Reviewed  CBC WITH DIFFERENTIAL/PLATELET - Abnormal; Notable for the following components:      Result Value   RBC 3.27 (*)    Hemoglobin 9.7 (*)    HCT 31.4 (*)    All other components within normal limits  COMPREHENSIVE METABOLIC PANEL WITH GFR - Abnormal; Notable for the following components:   Sodium 134 (*)    All other components within normal limits  URINALYSIS, ROUTINE W REFLEX MICROSCOPIC - Abnormal; Notable for the following components:   Color, Urine STRAW (*)    Ketones, ur 5 (*)    All other components within normal limits    EKG None  Radiology No results found.  Procedures Procedures  {Document cardiac monitor, telemetry assessment procedure when appropriate:1}  Medications Ordered in ED Medications  morphine  (PF) 4 MG/ML injection 4 mg (4 mg Intravenous Given 05/06/23 2218)    ED Course/ Medical Decision Making/ A&P                          Medical Decision Making Amount and/or Complexity of Data Reviewed Labs:  Decision-making details documented in ED Course. Radiology: ordered.  Risk Prescription drug management.    This patient presents to the ED for concern of back pain, this involves an extensive  number of treatment options, and is a complaint that carries with it a high risk of complications and morbidity.  I considered the following differential and admission for this acute, potentially life threatening condition.   MDM:    Patient had MRI of her thoracic spine but not of her lumbar spine.  She has not had any symptoms to indicate cauda equina.  Does have positive right straight leg raise which raises concern for sciatica.  Also consider lumbar radiculopathy, lumbar metastatic disease, lumbar compression fracture.  Consider new or worsening thoracic fractures as well.  Daughter at bedside states that they were never aware of any fall or trauma that caused the T11 fracture, must be her metastatic disease.  Patient has had worsening in severe pain, concerning for worsening possible injury.  Will obtain CT T and L-spine  for further evaluation.  Also consider possible pyelonephritis or renal stone, however she has no UTI or hematuria to indicate that, no leukocytosis or fever, Pain seems musculoskeletal in nature. Also no leukocytosis/fever that would indicate spinal epidural abscess, none represented on her MRI from last week.   Clinical Course as of 05/06/23 2332  Mon May 06, 2023  2156 Urinalysis, Routine w reflex microscopic -Urine, Clean Catch(!) No UTI [HN]  2331 Hemoglobin(!): 9.7 Stable anemia [HN]  2332 Comprehensive metabolic panel(!) Unremarkable in the context of this patient's presentation  [HN]  2332 WBC: 4.5 No leukocytosis  [HN]    Clinical Course User Index [HN] Merdis Stalling, MD    Labs: Labs ordered from triage, I personally interpreted labs.  The pertinent results include:  those listed above  Imaging Studies ordered: I ordered imaging studies including CT T and L spine I independently visualized and interpreted imaging. I agree with the radiologist interpretation  Additional history obtained from chart review, daughter at bedside.    Reevaluation: After  the interventions noted above, I reevaluated the patient and found that they have :improved  Social Determinants of Health: Lives with daughter  Disposition:  ***  Co morbidities that complicate the patient evaluation  Past Medical History:  Diagnosis Date   Arthritis    Breast cancer (HCC) 2020   Left Breast Cancer   Diabetes mellitus without complication (HCC)    Hypertension      Medicines Meds ordered this encounter  Medications   morphine  (PF) 4 MG/ML injection 4 mg    I have reviewed the patients home medicines and have made adjustments as needed  Problem List / ED Course: Problem List Items Addressed This Visit   None        {Document critical care time when appropriate:1} {Document review of labs and clinical decision tools ie heart score, Chads2Vasc2 etc:1}  {Document your independent review of radiology images, and any outside records:1} {Document your discussion with family members, caretakers, and with consultants:1} {Document social determinants of health affecting pt's care:1} {Document your decision making why or why not admission, treatments were needed:1}  This note was created using dictation software, which may contain spelling or grammatical errors.

## 2023-05-06 NOTE — ED Triage Notes (Signed)
 Patient c/o back pain x 2 weeks. Patient report taking PRN medication without relief. Patient recently diagnose for  T11 compression fracture. Patient c/o 10/10 back pain. Patient currently not using her back brace.

## 2023-05-06 NOTE — Telephone Encounter (Signed)
 Pt's daughter called to let us  know she is in ED for intractable pain. She states percocet prescribed by Dr Lee Public is no longer helping pain. Alejandra Hurst I would make MD aware.

## 2023-05-07 ENCOUNTER — Emergency Department (HOSPITAL_COMMUNITY)

## 2023-05-07 ENCOUNTER — Other Ambulatory Visit: Payer: Medicare Other

## 2023-05-07 ENCOUNTER — Ambulatory Visit: Payer: Medicare Other

## 2023-05-07 ENCOUNTER — Observation Stay (HOSPITAL_BASED_OUTPATIENT_CLINIC_OR_DEPARTMENT_OTHER)

## 2023-05-07 ENCOUNTER — Ambulatory Visit: Payer: Medicare Other | Admitting: Hematology and Oncology

## 2023-05-07 DIAGNOSIS — M549 Dorsalgia, unspecified: Secondary | ICD-10-CM | POA: Diagnosis not present

## 2023-05-07 DIAGNOSIS — D649 Anemia, unspecified: Secondary | ICD-10-CM | POA: Diagnosis present

## 2023-05-07 DIAGNOSIS — M7989 Other specified soft tissue disorders: Secondary | ICD-10-CM | POA: Diagnosis not present

## 2023-05-07 DIAGNOSIS — S22080A Wedge compression fracture of T11-T12 vertebra, initial encounter for closed fracture: Secondary | ICD-10-CM | POA: Diagnosis present

## 2023-05-07 DIAGNOSIS — E871 Hypo-osmolality and hyponatremia: Secondary | ICD-10-CM | POA: Diagnosis present

## 2023-05-07 DIAGNOSIS — E876 Hypokalemia: Secondary | ICD-10-CM | POA: Diagnosis present

## 2023-05-07 LAB — CBC
HCT: 28.3 % — ABNORMAL LOW (ref 36.0–46.0)
Hemoglobin: 9.1 g/dL — ABNORMAL LOW (ref 12.0–15.0)
MCH: 30.2 pg (ref 26.0–34.0)
MCHC: 32.2 g/dL (ref 30.0–36.0)
MCV: 94 fL (ref 80.0–100.0)
Platelets: 160 10*3/uL (ref 150–400)
RBC: 3.01 MIL/uL — ABNORMAL LOW (ref 3.87–5.11)
RDW: 12.6 % (ref 11.5–15.5)
WBC: 4.5 10*3/uL (ref 4.0–10.5)
nRBC: 0 % (ref 0.0–0.2)

## 2023-05-07 LAB — BASIC METABOLIC PANEL WITH GFR
Anion gap: 7 (ref 5–15)
BUN: 13 mg/dL (ref 8–23)
CO2: 25 mmol/L (ref 22–32)
Calcium: 9.3 mg/dL (ref 8.9–10.3)
Chloride: 100 mmol/L (ref 98–111)
Creatinine, Ser: 0.57 mg/dL (ref 0.44–1.00)
GFR, Estimated: >60 mL/min (ref >60)
Glucose, Bld: 97 mg/dL (ref 70–99)
Potassium: 3.4 mmol/L — ABNORMAL LOW (ref 3.5–5.1)
Sodium: 132 mmol/L — ABNORMAL LOW (ref 135–145)

## 2023-05-07 LAB — PHOSPHORUS: Phosphorus: 3.1 mg/dL (ref 2.5–4.6)

## 2023-05-07 LAB — MAGNESIUM: Magnesium: 1.5 mg/dL — ABNORMAL LOW (ref 1.7–2.4)

## 2023-05-07 MED ORDER — ACETAMINOPHEN 325 MG PO TABS: 650.0000 mg | ORAL_TABLET | Freq: Four times a day (QID) | ORAL | Status: DC | PRN

## 2023-05-07 MED ORDER — POTASSIUM CHLORIDE CRYS ER 20 MEQ PO TBCR
40.0000 meq | EXTENDED_RELEASE_TABLET | Freq: Once | ORAL | Status: AC
  Administered 2023-05-07: 40 meq via ORAL
  Filled 2023-05-07: qty 2

## 2023-05-07 MED ORDER — AMLODIPINE BESYLATE 10 MG PO TABS
10.0000 mg | ORAL_TABLET | Freq: Every day | ORAL | Status: DC
  Administered 2023-05-07 – 2023-05-09 (×3): 10 mg via ORAL
  Filled 2023-05-07: qty 1
  Filled 2023-05-07: qty 2

## 2023-05-07 MED ORDER — DOCUSATE SODIUM 50 MG PO CAPS
50.0000 mg | ORAL_CAPSULE | Freq: Every day | ORAL | Status: DC
  Administered 2023-05-07 – 2023-05-09 (×3): 50 mg via ORAL
  Filled 2023-05-07 (×4): qty 1

## 2023-05-07 MED ORDER — MAGNESIUM SULFATE 2 GM/50ML IV SOLN
2.0000 g | Freq: Once | INTRAVENOUS | Status: AC
  Administered 2023-05-07: 2 g via INTRAVENOUS
  Filled 2023-05-07: qty 50

## 2023-05-07 MED ORDER — MELATONIN 5 MG PO TABS
5.0000 mg | ORAL_TABLET | Freq: Every evening | ORAL | Status: DC | PRN
  Filled 2023-05-07: qty 1

## 2023-05-07 MED ORDER — LATANOPROST 0.005 % OP SOLN
1.0000 [drp] | Freq: Every day | OPHTHALMIC | Status: DC
Start: 2023-05-07 — End: 2023-05-09
  Administered 2023-05-07 – 2023-05-08 (×2): 1 [drp] via OPHTHALMIC
  Filled 2023-05-07: qty 2.5

## 2023-05-07 MED ORDER — POLYETHYLENE GLYCOL 3350 17 G PO PACK
17.0000 g | PACK | Freq: Every day | ORAL | Status: DC | PRN
  Filled 2023-05-07: qty 1

## 2023-05-07 MED ORDER — ENOXAPARIN SODIUM 40 MG/0.4ML IJ SOSY
40.0000 mg | PREFILLED_SYRINGE | INTRAMUSCULAR | Status: DC
Start: 2023-05-07 — End: 2023-05-09
  Administered 2023-05-07 – 2023-05-09 (×3): 40 mg via SUBCUTANEOUS
  Filled 2023-05-07 (×2): qty 0.4

## 2023-05-07 MED ORDER — PROCHLORPERAZINE EDISYLATE 10 MG/2ML IJ SOLN: 5.0000 mg | Freq: Four times a day (QID) | INTRAMUSCULAR | Status: DC | PRN

## 2023-05-07 MED ORDER — ATORVASTATIN CALCIUM 40 MG PO TABS
40.0000 mg | ORAL_TABLET | Freq: Every day | ORAL | Status: DC
  Administered 2023-05-07 – 2023-05-08 (×2): 40 mg via ORAL
  Filled 2023-05-07 (×2): qty 1

## 2023-05-07 MED ORDER — OXYCODONE-ACETAMINOPHEN 5-325 MG PO TABS
1.0000 | ORAL_TABLET | Freq: Three times a day (TID) | ORAL | Status: DC | PRN
  Administered 2023-05-07 – 2023-05-08 (×3): 1 via ORAL
  Filled 2023-05-07 (×3): qty 1

## 2023-05-07 MED ORDER — BRIMONIDINE TARTRATE 0.2 % OP SOLN
1.0000 [drp] | Freq: Two times a day (BID) | OPHTHALMIC | Status: DC
  Administered 2023-05-07 – 2023-05-09 (×5): 1 [drp] via OPHTHALMIC
  Filled 2023-05-07: qty 5

## 2023-05-07 MED ORDER — OXYCODONE HCL 5 MG PO TABS
5.0000 mg | ORAL_TABLET | Freq: Four times a day (QID) | ORAL | Status: DC | PRN
  Administered 2023-05-07 – 2023-05-09 (×2): 5 mg via ORAL
  Filled 2023-05-07 (×2): qty 1

## 2023-05-07 MED ORDER — METFORMIN HCL 500 MG PO TABS
500.0000 mg | ORAL_TABLET | Freq: Two times a day (BID) | ORAL | Status: DC
  Administered 2023-05-07 – 2023-05-09 (×5): 500 mg via ORAL
  Filled 2023-05-07 (×5): qty 1

## 2023-05-07 MED ORDER — ASPIRIN 81 MG PO TBEC
81.0000 mg | DELAYED_RELEASE_TABLET | Freq: Every day | ORAL | Status: DC
  Administered 2023-05-07 – 2023-05-09 (×3): 81 mg via ORAL
  Filled 2023-05-07 (×2): qty 1

## 2023-05-07 MED ORDER — LACTATED RINGERS IV SOLN: INTRAVENOUS | Status: AC

## 2023-05-07 NOTE — ED Notes (Signed)
 Patient given lunch tray.

## 2023-05-07 NOTE — ED Notes (Signed)
Patient assisted with eating breakfast tray

## 2023-05-07 NOTE — H&P (Signed)
 History and Physical    Patient: April Gallagher WGN:[id number] DOB: Nov 30, 1926 DOA: 05/06/2023 DOS: the patient was seen and examined on 05/07/2023 PCP: Yolanda Hence, MD  Patient coming from: Home  Chief Complaint:  Chief Complaint  Patient presents with   Back Pain   HPI: April Gallagher is a 89 y.o. female with medical history significant of arthritis, breast cancer, type 2 diabetes, hypertension who presented to the emergency department with complaints of exacerbation of back pain due to T11 compression fracture.  No paresthesias.  No fecal or urinary incontinence. He denied fever, chills, rhinorrhea, sore throat, wheezing or hemoptysis.  No chest pain, palpitations, diaphoresis, PND, orthopnea or pitting edema of the lower extremities.  No abdominal pain, nausea, emesis, diarrhea, constipation, melena or hematochezia.  No flank pain, dysuria, frequency or hematuria.  No polyuria, polydipsia, polyphagia or blurred vision.   Lab work: Urinalysis was straw with ketones of 5 mg/dL.  CBC showed a white count of 4.5, hemoglobin 9.7 g/dL and platelets 784.  Magnesium  1.5 and phosphorus 3.1 mg/dL.  CMP showed a sodium 134 mmol/L, but was otherwise unremarkable.  Imaging: CT thoracic/lumbar spine showing unchanged T11 compression fracture with approximately 25% height loss.  No acute fracture or static subluxation of the thoracic or lumbar spine.   ED course: Initial vital signs were temperature 98.9 F, pulse 83, respiration 18, BP 147/79 mmHg O2 sat 96% on room air.  The patient received 1000 mg of acetaminophen , magnesium  sulfate 2 g IVPB, morphine  4 mg IVP and KCl 40 mEq p.o. x 1.  Review of Systems: As mentioned in the history of present illness. All other systems reviewed and are negative. Past Medical History:  Diagnosis Date   Arthritis    Breast cancer (HCC) 2020   Left Breast Cancer   Diabetes mellitus without complication (HCC)    Hypertension    Past Surgical History:   Procedure Laterality Date   BREAST BIOPSY Left 2020   BREAST EXCISIONAL BIOPSY Right    Social History:  reports that she has never smoked. She has never used smokeless tobacco. She reports that she does not drink alcohol and does not use drugs.  No Known Allergies  History reviewed. No pertinent family history.  Prior to Admission medications   Medication Sig Start Date End Date Taking? Authorizing Provider  ACCU-CHEK SMARTVIEW test strip  08/06/18  Yes [provider]  amLODipine  (NORVASC ) 10 MG tablet Take 10 mg by mouth daily.   Yes [provider]  aspirin  EC 81 MG tablet Take 81 mg by mouth daily. Swallow whole.   Yes [provider]  atorvastatin  (LIPITOR) 40 MG tablet Take 40 mg by mouth at bedtime.    Yes [provider]  brimonidine  (ALPHAGAN ) 0.2 % ophthalmic solution INSTILL 1 DROP INTO RIGHT EYE TWICE DAILY 07/30/18  Yes [provider]  calcium  carbonate (OS-CAL) 600 MG TABS tablet Take 600 mg by mouth every morning.    Yes [provider]  Coenzyme Q10 (CO Q 10 PO) Take 1 tablet by mouth daily.   Yes [provider]  diclofenac Sodium (VOLTAREN) 1 % GEL diclofenac 1 % topical gel   Yes [provider]  docusate sodium  (COLACE) 50 MG capsule Take 50 mg by mouth daily.   Yes [provider]  GARLIC PO Take 1 tablet by mouth daily.    Yes [provider]  glucosamine-chondroitin 500-400 MG tablet Take 1 tablet by mouth daily.   Yes  [provider]  glucose blood test strip Accu-Chek SmartView Test Strips  CHECK BLOOD SUGAR TWICE  DAILY   Yes [provider]  latanoprost  (XALATAN ) 0.005 % ophthalmic solution Place 1 drop into the right eye at bedtime.    Yes [provider]  meclizine (ANTIVERT) 25 MG tablet meclizine 25 mg tablet   Yes [provider]  metFORMIN  (GLUCOPHAGE ) 500 MG tablet Take 500 mg by mouth 2 (two) times daily with a meal.    Yes  [provider]  oxyCODONE -acetaminophen  (PERCOCET/ROXICET) 5-325 MG tablet Take 1 tablet by mouth every 8 (eight) hours as needed for severe pain (pain score 7-10). 05/03/23  Yes Iruku, Praveena, MD  benzonatate  (TESSALON ) 100 MG capsule Take 1 capsule (100 mg total) by mouth 3 (three) times daily as needed for cough. Patient not taking: Reported on 05/07/2023 03/18/23   Banister, Pamela K, MD  clotrimazole-betamethasone (LOTRISONE) cream clotrimazole-betamethasone 1 %-0.05 % topical cream    [provider]  Flaxseed, Linseed, (FLAXSEED OIL PO) Take 1 tablet by mouth daily.  Patient not taking: Reported on 05/07/2023    [provider]  ketorolac  (TORADOL ) 60 MG/2ML SOLN injection ketorolac  60 mg/2 mL intramuscular solution  Inject 60 mg IM x 1 Patient not taking: Reported on 05/07/2023    [provider]  predniSONE (DELTASONE) 5 MG tablet prednisone 5 mg tablets in a dose pack  TAKE AS DIRECTED FOR 6 DAYS Patient not taking: Reported on 05/07/2023    [provider]  traMADol  (ULTRAM ) 50 MG tablet Take 1 tablet by mouth every 6 (six) hours as needed. Patient not taking: Reported on 05/07/2023    [provider]    Physical Exam: Vitals:   05/07/23 0100 05/07/23 0336 05/07/23 0459 05/07/23 0801  BP: (!) 153/72 134/70 (!) 156/76 (!) 166/94  Pulse: 78  74 91  Resp: 16  14 18   Temp:  98.5 F (36.9 C) 98.6 F (37 C) 97.9 F (36.6 C)  TempSrc:   Oral   SpO2: 100%  100% 100%   Physical Exam Vitals and nursing note reviewed.  Constitutional:      Appearance: Normal appearance. She is ill-appearing.  HENT:     Head: Normocephalic.     Nose: No rhinorrhea.     Mouth/Throat:     Mouth: Mucous membranes are moist.  Eyes:     General: No scleral icterus.    Pupils: Pupils are equal, round, and reactive to light.  Cardiovascular:     Rate and Rhythm: Normal rate and regular rhythm.  Pulmonary:     Effort: Pulmonary effort is normal.      Breath sounds: Normal breath sounds.  Abdominal:     General: Bowel sounds are normal. There is no distension.     Palpations: Abdomen is soft.     Tenderness: There is no abdominal tenderness. There is no right CVA tenderness or left CVA tenderness.  Musculoskeletal:     Cervical back: Neck supple.     Right lower leg: No edema.     Left lower leg: No edema.  Skin:    General: Skin is warm and dry.  Neurological:     General: No focal deficit present.     Mental Status: She is alert and oriented to person, place, and time.  Psychiatric:        Mood and Affect: Mood normal.        Behavior: Behavior normal.     Data Reviewed:  Results are pending, will review when available.  Assessment and Plan: Principal Problem:   Intractable back pain Due to:   Compression fracture of T11 vertebra (HCC)  Secondary to:   Malignant neoplasm of upper-inner quadrant of  left breast in female, estrogen receptor positive (HCC) Observation/telemetry. Analgesics as needed. Antiemetics as needed. Follow-up at the cancer center as scheduled.  Active Problems:   Benign essential hypertension Continue amlodipine  10 mg p.o. daily.    Hyperlipidemia Continue atorvastatin  40 mg p.o. daily.    Glaucoma Continue Alphagan  and Xalatan  drops.   Diabetic neuropathy (HCC) Continue oxycodone  as needed.    Hyponatremia Follow sodium level.    Hypokalemia Replaced. Follow-up potassium level in the morning.    Hypomagnesemia Magnesium  sulfate 2 g IVPB.    Normocytic anemia Monitor hematocrit hemoglobin.     Advance Care Planning:   Code Status: Full Code   Consults:   Family Communication:   Severity of Illness: The appropriate patient status for this patient is OBSERVATION. Observation status is judged to be reasonable and necessary in order to provide the required intensity of service to ensure the patient's safety. The patient's presenting symptoms, physical exam findings, and  initial radiographic and laboratory data in the context of their medical condition is felt to place them at decreased risk for further clinical deterioration. Furthermore, it is anticipated that the patient will be medically stable for discharge from the hospital within 2 midnights of admission.   Author: Danice Dural, MD 05/07/2023 8:22 AM  For on call review [email or web address].   This document was prepared using Dragon voice recognition software and may contain some unintended transcription errors.

## 2023-05-07 NOTE — ED Notes (Signed)
 Patient cleaned up. New brief and linens applied.

## 2023-05-07 NOTE — ED Notes (Signed)
 Ultrasound at bedside

## 2023-05-07 NOTE — Progress Notes (Signed)
 RLE venous duplex has been completed.   Results can be found under chart review under CV PROC. 05/07/2023 9:26 AM Zianna Dercole RVT, RDMS

## 2023-05-07 NOTE — Progress Notes (Signed)
 Patient arrived from emergency department by stretcher; patient settled in bed; daughter at bedside; will continue to monitor

## 2023-05-08 ENCOUNTER — Encounter: Payer: Self-pay | Admitting: Hematology and Oncology

## 2023-05-08 DIAGNOSIS — Z170 Estrogen receptor positive status [ER+]: Secondary | ICD-10-CM | POA: Diagnosis not present

## 2023-05-08 DIAGNOSIS — E1140 Type 2 diabetes mellitus with diabetic neuropathy, unspecified: Secondary | ICD-10-CM | POA: Diagnosis present

## 2023-05-08 DIAGNOSIS — D630 Anemia in neoplastic disease: Secondary | ICD-10-CM | POA: Diagnosis present

## 2023-05-08 DIAGNOSIS — E871 Hypo-osmolality and hyponatremia: Secondary | ICD-10-CM | POA: Diagnosis present

## 2023-05-08 DIAGNOSIS — Z7984 Long term (current) use of oral hypoglycemic drugs: Secondary | ICD-10-CM | POA: Diagnosis not present

## 2023-05-08 DIAGNOSIS — E785 Hyperlipidemia, unspecified: Secondary | ICD-10-CM | POA: Diagnosis present

## 2023-05-08 DIAGNOSIS — C50212 Malignant neoplasm of upper-inner quadrant of left female breast: Secondary | ICD-10-CM | POA: Diagnosis present

## 2023-05-08 DIAGNOSIS — M8458XA Pathological fracture in neoplastic disease, other specified site, initial encounter for fracture: Secondary | ICD-10-CM | POA: Diagnosis present

## 2023-05-08 DIAGNOSIS — Z7982 Long term (current) use of aspirin: Secondary | ICD-10-CM | POA: Diagnosis not present

## 2023-05-08 DIAGNOSIS — M549 Dorsalgia, unspecified: Secondary | ICD-10-CM | POA: Diagnosis not present

## 2023-05-08 DIAGNOSIS — S22089A Unspecified fracture of T11-T12 vertebra, initial encounter for closed fracture: Secondary | ICD-10-CM | POA: Diagnosis present

## 2023-05-08 DIAGNOSIS — H409 Unspecified glaucoma: Secondary | ICD-10-CM | POA: Diagnosis present

## 2023-05-08 DIAGNOSIS — M810 Age-related osteoporosis without current pathological fracture: Secondary | ICD-10-CM | POA: Diagnosis present

## 2023-05-08 DIAGNOSIS — Z853 Personal history of malignant neoplasm of breast: Secondary | ICD-10-CM | POA: Diagnosis not present

## 2023-05-08 DIAGNOSIS — Z79899 Other long term (current) drug therapy: Secondary | ICD-10-CM | POA: Diagnosis not present

## 2023-05-08 DIAGNOSIS — C7951 Secondary malignant neoplasm of bone: Secondary | ICD-10-CM | POA: Diagnosis present

## 2023-05-08 DIAGNOSIS — I10 Essential (primary) hypertension: Secondary | ICD-10-CM | POA: Diagnosis present

## 2023-05-08 DIAGNOSIS — E876 Hypokalemia: Secondary | ICD-10-CM | POA: Diagnosis present

## 2023-05-08 LAB — GLUCOSE, CAPILLARY
Glucose-Capillary: 92 mg/dL (ref 70–99)
Glucose-Capillary: 100 mg/dL — ABNORMAL HIGH (ref 70–99)
Glucose-Capillary: 86 mg/dL (ref 70–99)

## 2023-05-08 LAB — VITAMIN D 25 HYDROXY (VIT D DEFICIENCY, FRACTURES): Vit D, 25-Hydroxy: 58.85 ng/mL (ref 30–100)

## 2023-05-08 LAB — TSH: TSH: 1.212 u[IU]/mL (ref 0.350–4.500)

## 2023-05-08 MED ORDER — HYDRALAZINE HCL 20 MG/ML IJ SOLN: 10.0000 mg | INTRAMUSCULAR | Status: DC | PRN

## 2023-05-08 MED ORDER — GLUCAGON HCL RDNA (DIAGNOSTIC) 1 MG IJ SOLR
1.0000 mg | INTRAMUSCULAR | Status: DC | PRN
Start: 2023-05-08 — End: 2023-05-09

## 2023-05-08 MED ORDER — MORPHINE SULFATE (PF) 2 MG/ML IV SOLN: 2.0000 mg | INTRAVENOUS | Status: DC | PRN

## 2023-05-08 MED ORDER — IPRATROPIUM-ALBUTEROL 0.5-2.5 (3) MG/3ML IN SOLN: 3.0000 mL | RESPIRATORY_TRACT | Status: DC | PRN

## 2023-05-08 MED ORDER — INSULIN ASPART 100 UNIT/ML IJ SOLN: 0.0000 [IU] | Freq: Three times a day (TID) | INTRAMUSCULAR | Status: DC

## 2023-05-08 MED ORDER — METOPROLOL TARTRATE 5 MG/5ML IV SOLN: 5.0000 mg | INTRAVENOUS | Status: DC | PRN

## 2023-05-08 MED ORDER — LIDOCAINE 5 % EX PTCH
2.0000 | MEDICATED_PATCH | CUTANEOUS | Status: DC
  Administered 2023-05-08: 2 via TRANSDERMAL
  Filled 2023-05-08: qty 2

## 2023-05-08 MED ORDER — GUAIFENESIN 100 MG/5ML PO LIQD: 5.0000 mL | ORAL | Status: DC | PRN

## 2023-05-08 MED ORDER — KETOROLAC TROMETHAMINE 15 MG/ML IJ SOLN
15.0000 mg | Freq: Three times a day (TID) | INTRAMUSCULAR | Status: DC
  Administered 2023-05-08 – 2023-05-09 (×4): 15 mg via INTRAVENOUS
  Filled 2023-05-08 (×4): qty 1

## 2023-05-08 MED ORDER — ACETAMINOPHEN 500 MG PO TABS
1000.0000 mg | ORAL_TABLET | Freq: Three times a day (TID) | ORAL | Status: DC
  Administered 2023-05-08 (×3): 1000 mg via ORAL
  Administered 2023-05-09: 500 mg via ORAL
  Filled 2023-05-08 (×4): qty 2

## 2023-05-08 NOTE — Hospital Course (Addendum)
 Brief Narrative:   89 year old with history of osteoarthritis, breast cancer, DM2, HTN presented to the ED with complaints of back pain.  CT thoracic and lumbar spine showed unchanged T11 compression fracture.  Assessment & Plan:  Principal Problem:   Intractable back pain Active Problems:   Benign essential hypertension   Hyperlipidemia   Osteoporosis   Malignant neoplasm of upper-inner quadrant of left breast in female, estrogen receptor positive (HCC)   Diabetic neuropathy (HCC)   Hyponatremia   Hypokalemia   Normocytic anemia   Hypomagnesemia   Compression fracture of T11 vertebra (HCC)    Intractable back pain due to T11 compression fracture   Malignant neoplasm of upper-inner quadrant of  left breast in female, estrogen receptor positive (HCC) -T11 compression fracture was seen on the MRI on April 7 which was done outpatient.  This is pathologic fracture in the setting of metastatic disease. -For now we will proceed with aggressive pain control.  PT/OT.    Benign essential hypertension Continue amlodipine  10 mg p.o. daily. IV as needed     Hyperlipidemia Continue atorvastatin  40 mg p.o. daily.     Glaucoma Continue Alphagan  and Xalatan  drops.    Diabetic neuropathy (HCC) Continue oxycodone  as needed.     Hypokalemia/hypomagnesemia As needed repletion   Diabetes mellitus type 2 -Currently on metformin . Insulin  Accu-Cheks     Normocytic anemia Monitor hematocrit hemoglobin.   PT/OT  DVT prophylaxis: Lovenox     Code Status: Full Code Family Communication:  Called Alice [phone] Ongoing control for her back pain Hopefully dc 24 hrs.    Subjective:  Reporting of quite a bit of discomfort in her back  Examination:  General exam: Appears calm and comfortable  Respiratory system: Clear to auscultation. Respiratory effort normal. Cardiovascular system: S1 & S2 heard, RRR. No JVD, murmurs, rubs, gallops or clicks. No pedal edema. Gastrointestinal  system: Abdomen is nondistended, soft and nontender. No organomegaly or masses felt. Normal bowel sounds heard. Central nervous system: Alert and oriented. No focal neurological deficits. Extremities: Symmetric 5 x 5 power. Skin: No rashes, lesions or ulcers Psychiatry: Judgement and insight appear normal. Mood & affect appropriate.

## 2023-05-08 NOTE — Progress Notes (Signed)
 PT Cancellation Note  Patient Details Name: ALEEHA BOLINE MRN: [id number] DOB: [birth year]   Cancelled Treatment:    Reason Eval/Treat Not Completed: Other (comment)  Patient  eating and multiple  personnel just left room. Will check back later. Abelina Hoes PT Acute Rehabilitation Services Office [phone] Weekend pager-[phone]  Dareen Ebbing 05/08/2023, 10:29 AM

## 2023-05-08 NOTE — Progress Notes (Signed)
 PROGRESS NOTE    April Gallagher  WUJ:[id number] DOB: 1926-01-26 DOA: 05/06/2023 PCP: Yolanda Hence, MD    Brief Narrative:   89 year old with history of osteoarthritis, breast cancer, DM2, HTN presented to the ED with complaints of back pain.  CT thoracic and lumbar spine showed unchanged T11 compression fracture.  Assessment & Plan:  Principal Problem:   Intractable back pain Active Problems:   Benign essential hypertension   Hyperlipidemia   Osteoporosis   Malignant neoplasm of upper-inner quadrant of left breast in female, estrogen receptor positive (HCC)   Diabetic neuropathy (HCC)   Hyponatremia   Hypokalemia   Normocytic anemia   Hypomagnesemia   Compression fracture of T11 vertebra (HCC)    Intractable back pain due to T11 compression fracture   Malignant neoplasm of upper-inner quadrant of  left breast in female, estrogen receptor positive (HCC) -T11 compression fracture was seen on the MRI on April 7 which was done outpatient.  This is pathologic fracture in the setting of metastatic disease. -For now we will proceed with aggressive pain control.  PT/OT.    Benign essential hypertension Continue amlodipine  10 mg p.o. daily. IV as needed     Hyperlipidemia Continue atorvastatin  40 mg p.o. daily.     Glaucoma Continue Alphagan  and Xalatan  drops.    Diabetic neuropathy (HCC) Continue oxycodone  as needed.     Hypokalemia/hypomagnesemia As needed repletion   Diabetes mellitus type 2 -Currently on metformin . Insulin  Accu-Cheks     Normocytic anemia Monitor hematocrit hemoglobin.   PT/OT  DVT prophylaxis: Lovenox     Code Status: Full Code Family Communication:  Called Alice [phone] Ongoing control for her back pain Hopefully dc 24 hrs.    Subjective:  Reporting of quite a bit of discomfort in her back  Examination:  General exam: Appears calm and comfortable  Respiratory system: Clear to auscultation. Respiratory effort  normal. Cardiovascular system: S1 & S2 heard, RRR. No JVD, murmurs, rubs, gallops or clicks. No pedal edema. Gastrointestinal system: Abdomen is nondistended, soft and nontender. No organomegaly or masses felt. Normal bowel sounds heard. Central nervous system: Alert and oriented. No focal neurological deficits. Extremities: Symmetric 5 x 5 power. Skin: No rashes, lesions or ulcers Psychiatry: Judgement and insight appear normal. Mood & affect appropriate.         Diet Orders (From admission, onward)     Start     Ordered   05/07/23 0107  Diet Heart Room service appropriate? Yes; Fluid consistency: Thin  Diet effective now       Question Answer Comment  Room service appropriate? Yes   Fluid consistency: Thin      05/07/23 0106            Objective: Vitals:   05/07/23 1745 05/07/23 2011 05/08/23 0116 05/08/23 0550  BP: (!) 160/74 (!) 157/85 131/70 (!) 153/74  Pulse: 76 84 70 81  Resp: 18 16 16 15   Temp:  97.8 F (36.6 C) 98.4 F (36.9 C) 98 F (36.7 C)  TempSrc:  Oral Oral Oral  SpO2: 98% 98% 96% 98%  Weight:      Height:        Intake/Output Summary (Last 24 hours) at 05/08/2023 1155 Last data filed at 05/08/2023 0553 Gross per 24 hour  Intake 1167.64 ml  Output 825 ml  Net 342.64 ml   Filed Weights   05/07/23 1714  Weight: 72 kg    Scheduled Meds:  acetaminophen   1,000 mg Oral Q8H  amLODipine   10 mg Oral Daily   aspirin  EC  81 mg Oral Daily   atorvastatin   40 mg Oral QHS   brimonidine   1 drop Right Eye BID   docusate sodium   50 mg Oral Daily   enoxaparin  (LOVENOX ) injection  40 mg Subcutaneous Q24H   insulin  aspart  0-9 Units Subcutaneous TID WC   ketorolac   15 mg Intravenous Q8H   latanoprost   1 drop Right Eye QHS   lidocaine   2 patch Transdermal Q24H   metFORMIN   500 mg Oral BID WC   Continuous Infusions:  Nutritional status     Body mass index is 29.03 kg/m.  Data Reviewed:   CBC: Recent Labs  Lab 05/06/23 1519 05/07/23 0445   WBC 4.5 4.5  NEUTROABS 2.8  --   HGB 9.7* 9.1*  HCT 31.4* 28.3*  MCV 96.0 94.0  PLT 185 160   Basic Metabolic Panel: Recent Labs  Lab 05/06/23 1519 05/07/23 0445  NA 134* 132*  K 4.2 3.4*  CL 100 100  CO2 24 25  GLUCOSE 95 97  BUN 14 13  CREATININE 0.58 0.57  CALCIUM  9.8 9.3  MG  --  1.5*  PHOS  --  3.1   GFR: Estimated Creatinine Clearance: 39.1 mL/min (by C-G formula based on SCr of 0.57 mg/dL). Liver Function Tests: Recent Labs  Lab 05/06/23 1519  AST 15  ALT 9  ALKPHOS 49  BILITOT 0.7  PROT 7.4  ALBUMIN 3.9   No results for input(s): "LIPASE", "AMYLASE" in the last 168 hours. No results for input(s): "AMMONIA" in the last 168 hours. Coagulation Profile: No results for input(s): "INR", "PROTIME" in the last 168 hours. Cardiac Enzymes: No results for input(s): "CKTOTAL", "CKMB", "CKMBINDEX", "TROPONINI" in the last 168 hours. BNP (last 3 results) No results for input(s): "PROBNP" in the last 8760 hours. HbA1C: No results for input(s): "HGBA1C" in the last 72 hours. CBG: No results for input(s): "GLUCAP" in the last 168 hours. Lipid Profile: No results for input(s): "CHOL", "HDL", "LDLCALC", "TRIG", "CHOLHDL", "LDLDIRECT" in the last 72 hours. Thyroid Function Tests: No results for input(s): "TSH", "T4TOTAL", "FREET4", "T3FREE", "THYROIDAB" in the last 72 hours. Anemia Panel: No results for input(s): "VITAMINB12", "FOLATE", "FERRITIN", "TIBC", "IRON", "RETICCTPCT" in the last 72 hours. Sepsis Labs: No results for input(s): "PROCALCITON", "LATICACIDVEN" in the last 168 hours.  No results found for this or any previous visit (from the past 240 hours).       Radiology Studies: VAS US  LOWER EXTREMITY VENOUS (DVT) (7a-7p) Result Date: 05/07/2023  Lower Venous DVT Study Patient Name:  ILENA DIECKMAN  Date of Exam:   05/07/2023 Medical Rec #: [id number]      Accession #:    [id number] Date of Birth: [birth year]      Patient Gender: F Patient Age:   89 years  Exam Location:  [facility] Procedure:      VAS US  LOWER EXTREMITY VENOUS (DVT) Referring Phys: HAYLEY NAASZ --------------------------------------------------------------------------------  Indications: Back and leg pain.  Limitations: Poor ultrasound/tissue interface and suboptimal positioning due to patient immobility. Comparison Study: Previous exam on 12/13/2021 Performing Technologist: Arlyce Berger RVT, RDMS  Examination Guidelines: A complete evaluation includes B-mode imaging, spectral Doppler, color Doppler, and power Doppler as needed of all accessible portions of each vessel. Bilateral testing is considered an integral part of a complete examination. Limited examinations for reoccurring indications may be performed as noted. The reflux portion of the exam is performed with the patient  in reverse Trendelenburg.  +---------+---------------+---------+-----------+----------+-------------------+ RIGHT    CompressibilityPhasicitySpontaneityPropertiesThrombus Aging      +---------+---------------+---------+-----------+----------+-------------------+ CFV      Full           Yes      Yes                                      +---------+---------------+---------+-----------+----------+-------------------+ SFJ      Full                                                             +---------+---------------+---------+-----------+----------+-------------------+ FV Prox  Full           Yes      Yes                                      +---------+---------------+---------+-----------+----------+-------------------+ FV Mid   Full           Yes      Yes                                      +---------+---------------+---------+-----------+----------+-------------------+ FV DistalFull           Yes      Yes                                      +---------+---------------+---------+-----------+----------+-------------------+ PFV      Full                                                              +---------+---------------+---------+-----------+----------+-------------------+ POP      Full           Yes      Yes                                      +---------+---------------+---------+-----------+----------+-------------------+ PTV                                                   Not well visualized +---------+---------------+---------+-----------+----------+-------------------+ PERO                                                  Not well visualized +---------+---------------+---------+-----------+----------+-------------------+   +----+---------------+---------+-----------+----------+--------------+ LEFTCompressibilityPhasicitySpontaneityPropertiesThrombus Aging +----+---------------+---------+-----------+----------+--------------+ CFV Full           Yes      Yes                                 +----+---------------+---------+-----------+----------+--------------+  Summary: RIGHT: - There is no evidence of deep vein thrombosis in the lower extremity in areas visualized.  - No cystic structure found in the popliteal fossa. subcutaneous edema of calf and ankle.  LEFT: - No evidence of common femoral vein obstruction.   *See table(s) above for measurements and observations. Electronically signed by Angela Kell MD on 05/07/2023 at 5:10:28 PM.    Final    CT Thoracic Spine Wo Contrast Result Date: 05/07/2023 CLINICAL DATA:  Compression fracture EXAM: CT THORACIC AND LUMBAR SPINE WITHOUT CONTRAST TECHNIQUE: Multidetector CT imaging of the thoracic and lumbar spine was performed without contrast. Multiplanar CT image reconstructions were also generated. RADIATION DOSE REDUCTION: This exam was performed according to the departmental dose-optimization program which includes automated exposure control, adjustment of the mA and/or kV according to patient size and/or use of iterative reconstruction technique. COMPARISON:  04/22/2023 FINDINGS: CT  THORACIC SPINE FINDINGS Alignment: Normal. Vertebrae: T11 compression fracture with approximately 25% height loss, unchanged. Paraspinal and other soft tissues: Calcific aortic atherosclerosis. Disc levels: No spinal canal stenosis CT LUMBAR SPINE FINDINGS Segmentation: Sacralization of L5. The lowest fully formed disc space is L4-5. Alignment: Grade 1 anterolisthesis at L2-3 and L3-4 Vertebrae: No acute fracture or focal pathologic process. Paraspinal and other soft tissues: Heterogeneous and partially calcified left lower pole renal mass Disc levels: No spinal canal stenosis. Mild right and moderate left L3 foraminal stenosis. Moderate right L4 foraminal stenosis. IMPRESSION: 1. Unchanged T11 compression fracture with approximately 25% height loss. 2. No acute fracture or static subluxation of the thoracic or lumbar spine. 3. Heterogeneous and partially calcified left lower pole renal mass, unchanged. This remains most consistent with renal cell carcinoma. Aortic Atherosclerosis (ICD10-I70.0). Electronically Signed   By: Juanetta Nordmann M.D.   On: 05/07/2023 03:21   CT Lumbar Spine Wo Contrast Result Date: 05/07/2023 CLINICAL DATA:  Compression fracture EXAM: CT THORACIC AND LUMBAR SPINE WITHOUT CONTRAST TECHNIQUE: Multidetector CT imaging of the thoracic and lumbar spine was performed without contrast. Multiplanar CT image reconstructions were also generated. RADIATION DOSE REDUCTION: This exam was performed according to the departmental dose-optimization program which includes automated exposure control, adjustment of the mA and/or kV according to patient size and/or use of iterative reconstruction technique. COMPARISON:  04/22/2023 FINDINGS: CT THORACIC SPINE FINDINGS Alignment: Normal. Vertebrae: T11 compression fracture with approximately 25% height loss, unchanged. Paraspinal and other soft tissues: Calcific aortic atherosclerosis. Disc levels: No spinal canal stenosis CT LUMBAR SPINE FINDINGS Segmentation:  Sacralization of L5. The lowest fully formed disc space is L4-5. Alignment: Grade 1 anterolisthesis at L2-3 and L3-4 Vertebrae: No acute fracture or focal pathologic process. Paraspinal and other soft tissues: Heterogeneous and partially calcified left lower pole renal mass Disc levels: No spinal canal stenosis. Mild right and moderate left L3 foraminal stenosis. Moderate right L4 foraminal stenosis. IMPRESSION: 1. Unchanged T11 compression fracture with approximately 25% height loss. 2. No acute fracture or static subluxation of the thoracic or lumbar spine. 3. Heterogeneous and partially calcified left lower pole renal mass, unchanged. This remains most consistent with renal cell carcinoma. Aortic Atherosclerosis (ICD10-I70.0). Electronically Signed   By: Juanetta Nordmann M.D.   On: 05/07/2023 03:21           LOS: 0 days   Time spent= 35 mins    Kindel Schooner, MD [facility]  If 7PM-7AM, please contact night-coverage  05/08/2023, 11:55 AM

## 2023-05-08 NOTE — TOC Initial Note (Signed)
 Transition of Care [facility]) - Initial/Assessment Note    Patient Details  Name: April Gallagher MRN: [id number] Date of Birth: 1926/08/29  [facility] of [facility]) CM/SW Contact:    Tessie Fila, RN Phone Number: 05/08/2023, 3:10 PM  Clinical Narrative:                 Pt from home, currently lives with her granddaughter. PT/OT consulted. TOC will continue to follow for any needs.  Expected Discharge Plan: Home/Self Care Barriers to Discharge: Continued Medical Work up   Patient Goals and CMS Choice Patient states their goals for this hospitalization and ongoing recovery are:: To return home at discharge CMS Medicare.gov Compare Post Acute Care list provided to:: Patient Choice offered to / list presented to : NA [employer] ownership interest in [facility].provided to:: Parent NA    Expected Discharge Plan and Services   Discharge Planning Services: CM Consult Post Acute Care Choice: NA Living arrangements for the past 2 months: Single Family Home                                      Prior Living Arrangements/Services Living arrangements for the past 2 months: Single Family Home Lives with:: Adult Children, Other (Comment) (Pt granddaughter) Patient language and need for interpreter reviewed:: Yes Do you feel safe going back to the place where you live?: Yes      Need for Family Participation in Patient Care: Yes (Comment) Care giver support system in place?: Yes (comment)   Criminal Activity/Legal Involvement Pertinent to Current Situation/Hospitalization: No - Comment as needed  Activities of Daily Living   ADL Screening (condition at time of admission) Independently performs ADLs?: No Does the patient have a NEW difficulty with bathing/dressing/toileting/self-feeding that is expected to last >3 days?: Yes (Initiates electronic notice to provider for possible OT consult) Does the patient have a NEW difficulty with getting in/out of bed, walking, or  climbing stairs that is expected to last >3 days?: Yes (Initiates electronic notice to provider for possible PT consult) Does the patient have a NEW difficulty with communication that is expected to last >3 days?: No Is the patient deaf or have difficulty hearing?: Yes Does the patient have difficulty seeing, even when wearing glasses/contacts?: No Does the patient have difficulty concentrating, remembering, or making decisions?: Yes  Permission Sought/Granted Permission sought to share information with : Facility Medical sales representative, Family Supports Permission granted to share information with : Yes, Release of Information Signed  Share Information with NAME: Dewain Foot (Daughter)  [phone]           Emotional Assessment Appearance:: Appears stated age Attitude/Demeanor/Rapport: Engaged Affect (typically observed): Accepting, Appropriate Orientation: : Oriented to Self, Oriented to Place, Oriented to  Time, Oriented to Situation Alcohol / Substance Use: Never Used Psych Involvement: No (comment)  Admission diagnosis:  Intractable back pain [M54.9] Closed fracture of eleventh thoracic vertebra, unspecified fracture morphology, initial encounter [facility]) [S22.089A] Patient Active Problem List   Diagnosis Date Noted   Intractable back pain 05/07/2023   Hyponatremia 05/07/2023   Hypokalemia 05/07/2023   Normocytic anemia 05/07/2023   Hypomagnesemia 05/07/2023   Compression fracture of T11 vertebra (HCC) 05/07/2023   Sensorineural hearing loss (SNHL) of both ears 06/12/2022   Type 2 diabetes mellitus with vascular disease (HCC) 01/24/2021   Diabetic neuropathy (HCC) 01/24/2021   Malignant neoplasm of upper-inner quadrant of left breast in  female, estrogen receptor positive (HCC) 09/02/2018   Benign essential hypertension 01/29/2018   Hyperlipidemia 01/29/2018   Obesity 01/29/2018   Osteoarthritis of knee 01/29/2018   Osteoporosis 01/29/2018   Type 2 diabetes mellitus  without complication (HCC) 01/29/2018   Vitamin D  deficiency 01/29/2018   Abnormal gait 10/13/2014   Low back pain 07/05/2014   Secondary open-angle glaucoma of right eye, moderate stage 05/15/2014   Pseudophakia of both eyes 11/09/2013   PCO (posterior capsular opacification) 05/27/2013   PCP:  Yolanda Hence, MD Pharmacy:   [id number] [address] ([address]), [place] - [address] [place] ([place]) [place] [address] Phone: [phone] Fax: [phone]     Social Drivers of Health (SDOH) Social History: SDOH Screenings   Food Insecurity: Patient Declined (05/07/2023)  Housing: Patient Declined (05/07/2023)  Transportation Needs: Patient Declined (05/07/2023)  Utilities: Patient Declined (05/07/2023)  Social Connections: Patient Declined (05/07/2023)  Tobacco Use: Low Risk  (05/06/2023)   SDOH Interventions:     Readmission Risk Interventions    05/08/2023    3:06 PM  Readmission Risk Prevention Plan  Transportation Screening Complete  PCP or Specialist Appt within 5-7 Days Complete  Home Care Screening Complete  Medication Review (RN CM) Complete

## 2023-05-08 NOTE — Plan of Care (Signed)

## 2023-05-08 NOTE — Plan of Care (Signed)
  Problem: Pain Managment: Goal: General experience of comfort will improve and/or be controlled Outcome: Progressing   Problem: Safety: Goal: Ability to remain free from injury will improve Outcome: Progressing   Problem: Skin Integrity: Goal: Risk for impaired skin integrity will decrease Outcome: Progressing

## 2023-05-08 NOTE — Care Management Obs Status (Signed)
 MEDICARE OBSERVATION STATUS NOTIFICATION   Patient Details  Name: April Gallagher MRN: [id number] Date of Birth: 07-Aug-1926   Medicare Observation Status Notification Given:  Yes    Zeffie Bickert A Trevel Dillenbeck, LCSW 05/08/2023, 10:29 AM

## 2023-05-09 ENCOUNTER — Other Ambulatory Visit: Payer: Self-pay | Admitting: Internal Medicine

## 2023-05-09 DIAGNOSIS — M549 Dorsalgia, unspecified: Secondary | ICD-10-CM | POA: Diagnosis not present

## 2023-05-09 LAB — BASIC METABOLIC PANEL WITH GFR
Anion gap: 6 (ref 5–15)
BUN: 11 mg/dL (ref 8–23)
CO2: 26 mmol/L (ref 22–32)
Calcium: 9.4 mg/dL (ref 8.9–10.3)
Chloride: 100 mmol/L (ref 98–111)
Creatinine, Ser: 0.47 mg/dL (ref 0.44–1.00)
GFR, Estimated: >60 mL/min (ref >60)
Glucose, Bld: 99 mg/dL (ref 70–99)
Potassium: 4 mmol/L (ref 3.5–5.1)
Sodium: 132 mmol/L — ABNORMAL LOW (ref 135–145)

## 2023-05-09 LAB — CBC
HCT: 28.9 % — ABNORMAL LOW (ref 36.0–46.0)
Hemoglobin: 9.2 g/dL — ABNORMAL LOW (ref 12.0–15.0)
MCH: 29.6 pg (ref 26.0–34.0)
MCHC: 31.8 g/dL (ref 30.0–36.0)
MCV: 92.9 fL (ref 80.0–100.0)
Platelets: 166 10*3/uL (ref 150–400)
RBC: 3.11 MIL/uL — ABNORMAL LOW (ref 3.87–5.11)
RDW: 12.5 % (ref 11.5–15.5)
WBC: 3.4 10*3/uL — ABNORMAL LOW (ref 4.0–10.5)
nRBC: 0 % (ref 0.0–0.2)

## 2023-05-09 LAB — GLUCOSE, CAPILLARY: Glucose-Capillary: 98 mg/dL (ref 70–99)

## 2023-05-09 MED ORDER — POLYETHYLENE GLYCOL 3350 17 G PO PACK: 17.0000 g | PACK | Freq: Two times a day (BID) | ORAL | 1 refills | Status: DC | PRN

## 2023-05-09 MED ORDER — POLYETHYLENE GLYCOL 3350 17 G PO PACK: 17.0000 g | PACK | Freq: Two times a day (BID) | ORAL | Status: DC | PRN

## 2023-05-09 MED ORDER — OXYCODONE HCL 5 MG PO CAPS: 5.0000 mg | ORAL_CAPSULE | Freq: Four times a day (QID) | ORAL | 0 refills | Status: DC | PRN

## 2023-05-09 MED ORDER — OXYCODONE HCL 5 MG PO TABS: 5.0000 mg | ORAL_TABLET | Freq: Four times a day (QID) | ORAL | 0 refills | Status: DC | PRN

## 2023-05-09 MED ORDER — LIDOCAINE 5 % EX PTCH: 2.0000 | MEDICATED_PATCH | CUTANEOUS | 0 refills | Status: DC

## 2023-05-09 MED ORDER — CYCLOBENZAPRINE HCL 5 MG PO TABS: 5.0000 mg | ORAL_TABLET | Freq: Three times a day (TID) | ORAL | 0 refills | Status: DC | PRN

## 2023-05-09 NOTE — Evaluation (Signed)
 Physical Therapy Evaluation Patient Details Name: April Gallagher MRN: [id number] DOB: 02/29/1926 Today's Date: 05/09/2023  History of Present Illness  April Gallagher is a 89 y.o. female with medical history significant of arthritis, breast cancer, type 2 diabetes, hypertension who presented to the emergency department with complaints of exacerbation of back pain due to T11 compression fracture.  Clinical Impression  Pt admitted with above diagnosis. Pt up in recliner when PT arrives, agreeable to evaluation. Pt states that she has 24/7 support at home between her dtr and granddaughter. She typically uses 4WW at home, but would benefit from the stability of 2WW at this time. Pt able to amb x16ft with good safety. Pt currently with functional limitations due to the deficits listed below (see PT Problem List). Pt will benefit from acute skilled PT to increase their independence and safety with mobility to allow discharge.           If plan is discharge home, recommend the following: A little help with walking and/or transfers;A little help with bathing/dressing/bathroom;Assistance with cooking/housework;Assist for transportation   Can travel by private vehicle        Equipment Recommendations Rolling walker (2 wheels)  Recommendations for Other Services       Functional Status Assessment Patient has had a recent decline in their functional status and demonstrates the ability to make significant improvements in function in a reasonable and predictable amount of time.     Precautions / Restrictions Precautions Precautions: Fall Recall of Precautions/Restrictions: Intact Restrictions Weight Bearing Restrictions Per Provider Order: No      Mobility  Bed Mobility               General bed mobility comments: pt in recliner when PT arrives    Transfers Overall transfer level: Needs assistance Equipment used: Rolling walker (2 wheels) Transfers: Sit to/from Stand Sit to Stand:  Supervision           General transfer comment: Increased time to come to standing, good hand placement    Ambulation/Gait Ambulation/Gait assistance: Supervision Gait Distance (Feet): 80 Feet Assistive device: Rolling walker (2 wheels) Gait Pattern/deviations: Trunk flexed, Wide base of support, Step-through pattern, Decreased stride length Gait velocity: dec     General Gait Details: Pt able to maintain gait line, amb with 2WW using inc UE support through AD. She has siginifcant trunk flexion due to hx of t11 fx. Good directional changes. Covers distance required for home entry and in home mobility  Stairs            Wheelchair Mobility     Tilt Bed    Modified Rankin (Stroke Patients Only)       Balance Overall balance assessment: Needs assistance Sitting-balance support: Feet supported, No upper extremity supported Sitting balance-Leahy Scale: Fair     Standing balance support: Reliant on assistive device for balance, Bilateral upper extremity supported Standing balance-Leahy Scale: Poor                               Pertinent Vitals/Pain Pain Assessment Pain Assessment: 0-10 Pain Score: 7  Pain Location: thoracic spine Pain Descriptors / Indicators: Aching, Constant, Guarding, Discomfort (fluctuates with activity but pt reports overall feeling much better) Pain Intervention(s): Limited activity within patient's tolerance, Monitored during session, Repositioned    Home Living Family/patient expects to be discharged to:: Private residence Living Arrangements: Children;Other relatives (dtr present during the day, graddtr there at  night and intermittently during the day) Available Help at Discharge: Available 24 hours/day Type of Home: House Home Access: Level entry       Home Layout: One level Home Equipment: Rollator (4 wheels);Cane - single point;Tub bench;Grab bars - tub/shower      Prior Function Prior Level of Function :  Independent/Modified Independent                     Extremity/Trunk Assessment        Lower Extremity Assessment Lower Extremity Assessment: Generalized weakness    Cervical / Trunk Assessment Cervical / Trunk Assessment: Kyphotic (Hx t11 fx)  Communication   Communication Communication: No apparent difficulties    Cognition Arousal: Alert Behavior During Therapy: WFL for tasks assessed/performed   PT - Cognitive impairments: No apparent impairments                         Following commands: Intact       Cueing Cueing Techniques: Verbal cues, Gestural cues     General Comments      Exercises     Assessment/Plan    PT Assessment Patient needs continued PT services  PT Problem List Decreased strength;Decreased mobility;Decreased activity tolerance;Decreased balance       PT Treatment Interventions DME instruction;Functional mobility training;Balance training;Patient/family education;Gait training;Therapeutic activities;Therapeutic exercise    PT Goals (Current goals can be found in the Care Plan section)  Acute Rehab PT Goals Patient Stated Goal: return home PT Goal Formulation: With patient Time For Goal Achievement: 05/23/23 Potential to Achieve Goals: Good    Frequency Min 2X/week     Co-evaluation               AM-PAC PT "6 Clicks" Mobility  Outcome Measure Help needed turning from your back to your side while in a flat bed without using bedrails?: A Little Help needed moving from lying on your back to sitting on the side of a flat bed without using bedrails?: A Little Help needed moving to and from a bed to a chair (including a wheelchair)?: A Little Help needed standing up from a chair using your arms (e.g., wheelchair or bedside chair)?: A Little Help needed to walk in hospital room?: A Little Help needed climbing 3-5 steps with a railing? : A Lot 6 Click Score: 17    End of Session Equipment Utilized During  Treatment: Gait belt Activity Tolerance: Patient tolerated treatment well Patient left: in chair;with call bell/phone within reach;with chair alarm set Nurse Communication: Mobility status PT Visit Diagnosis: Difficulty in walking, not elsewhere classified (R26.2);Other abnormalities of gait and mobility (R26.89);Pain Pain - Right/Left:  (thoracic spine)    Time: 1610-9604 PT Time Calculation (min) (ACUTE ONLY): 20 min   Charges:   PT Evaluation $PT Eval Low Complexity: 1 Low   PT General Charges $$ ACUTE PT VISIT: 1 Visit         Darien Eden, PT Acute Rehabilitation Services Office: [phone] 05/09/2023   Serafin Dames 05/09/2023, 10:09 AM

## 2023-05-09 NOTE — Discharge Summary (Signed)
 Physician Discharge Summary  April Gallagher ZOX:[id number] DOB: February 13, 1926 DOA: 05/06/2023  PCP: Yolanda Hence, MD  Admit date: 05/06/2023 Discharge date: 05/09/2023  Admitted From: Home Disposition: Home  Recommendations for Outpatient Follow-up:  Follow up with PCP in 1-2 weeks Please obtain BMP/CBC in one week your next doctors visit.  Pain med occasional bowel regimen prescribed.  Lidocaine  has been prescribed as well  Home Health: PT/OT Equipment/Devices: Walker Discharge Condition: Stable CODE STATUS: Full code Diet recommendation: Diabetic  Brief/Interim Summary: Brief Narrative:   89 year old with history of osteoarthritis, breast cancer, DM2, HTN presented to the ED with complaints of back pain.  CT thoracic and lumbar spine showed unchanged T11 compression fracture.  During the hospitalization, she was started on pain regimen.  Her pain was eventually better controlled.  PT/OT recommended home health therefore it was arranged. Today she is medically stable for discharge.  Assessment & Plan:  Principal Problem:   Intractable back pain Active Problems:   Benign essential hypertension   Hyperlipidemia   Osteoporosis   Malignant neoplasm of upper-inner quadrant of left breast in female, estrogen receptor positive (HCC)   Diabetic neuropathy (HCC)   Hyponatremia   Hypokalemia   Normocytic anemia   Hypomagnesemia   Compression fracture of T11 vertebra (HCC)    Intractable back pain due to T11 compression fracture   Malignant neoplasm of upper-inner quadrant of  left breast in female, estrogen receptor positive (HCC) -T11 compression fracture was seen on the MRI on April 7 which was done outpatient.  This is pathologic fracture in the setting of metastatic disease. -For now we will proceed with aggressive pain control.  PT/OT is recommending home health services which will be arranged.    Benign essential hypertension Continue amlodipine  10 mg p.o. daily.      Hyperlipidemia Continue atorvastatin  40 mg p.o. daily.     Glaucoma Continue Alphagan  and Xalatan  drops.    Diabetic neuropathy (HCC) Continue oxycodone  as needed.     Hypokalemia/hypomagnesemia As needed repletion   Diabetes mellitus type 2 -Currently on metformin . Insulin  Accu-Cheks     Normocytic anemia Monitor hematocrit hemoglobin.  DVT prophylaxis: Lovenox     Code Status: Full Code Family Communication:  Called Alice [phone], no answer today Discharge today   Subjective:  Patient she feels great today and wishes to go home. Ambulating in the hallway with therapy staff and assistive device.  Examination:  General exam: Appears calm and comfortable.  Elderly frail Respiratory system: Clear to auscultation. Respiratory effort normal. Cardiovascular system: S1 & S2 heard, RRR. No JVD, murmurs, rubs, gallops or clicks. No pedal edema. Gastrointestinal system: Abdomen is nondistended, soft and nontender. No organomegaly or masses felt. Normal bowel sounds heard. Central nervous system: Alert and oriented. No focal neurological deficits. Extremities: Symmetric 5 x 5 power. Skin: No rashes, lesions or ulcers Psychiatry: Judgement and insight appear normal. Mood & affect appropriate.    Discharge Diagnoses:  Principal Problem:   Intractable back pain Active Problems:   Benign essential hypertension   Hyperlipidemia   Osteoporosis   Malignant neoplasm of upper-inner quadrant of left breast in female, estrogen receptor positive (HCC)   Diabetic neuropathy (HCC)   Hyponatremia   Hypokalemia   Normocytic anemia   Hypomagnesemia   Compression fracture of T11 vertebra (HCC)      Discharge Exam: Vitals:   05/08/23 2030 05/09/23 0409  BP: 136/72 (!) 151/78  Pulse: 76 70  Resp: 18 18  Temp: 99 F (37.2 C)  98.2 F (36.8 C)  SpO2: 98% 96%   Vitals:   05/08/23 0550 05/08/23 1227 05/08/23 2030 05/09/23 0409  BP: (!) 153/74 (!) 141/71 136/72 (!) 151/78   Pulse: 81 74 76 70  Resp: 15 18 18 18   Temp: 98 F (36.7 C) 98.4 F (36.9 C) 99 F (37.2 C) 98.2 F (36.8 C)  TempSrc: Oral Oral    SpO2: 98% 100% 98% 96%  Weight:      Height:          Discharge Instructions   Allergies as of 05/09/2023   No Known Allergies      Medication List     STOP taking these medications    ketorolac  60 MG/2ML Soln injection Commonly known as: TORADOL    oxyCODONE -acetaminophen  5-325 MG tablet Commonly known as: PERCOCET/ROXICET   predniSONE 5 MG tablet Commonly known as: DELTASONE   traMADol  50 MG tablet Commonly known as: ULTRAM        TAKE these medications    glucose blood test strip Accu-Chek SmartView Test Strips  CHECK BLOOD SUGAR TWICE  DAILY   Accu-Chek SmartView test strip Generic drug: glucose blood   amLODipine  10 MG tablet Commonly known as: NORVASC  Take 10 mg by mouth daily.   aspirin  EC 81 MG tablet Take 81 mg by mouth daily. Swallow whole.   atorvastatin  40 MG tablet Commonly known as: LIPITOR Take 40 mg by mouth at bedtime.   benzonatate  100 MG capsule Commonly known as: TESSALON  Take 1 capsule (100 mg total) by mouth 3 (three) times daily as needed for cough.   brimonidine  0.2 % ophthalmic solution Commonly known as: ALPHAGAN  INSTILL 1 DROP INTO RIGHT EYE TWICE DAILY   calcium  carbonate 600 MG Tabs tablet Commonly known as: OS-CAL Take 600 mg by mouth every morning.   clotrimazole-betamethasone cream Commonly known as: LOTRISONE clotrimazole-betamethasone 1 %-0.05 % topical cream   CO Q 10 PO Take 1 tablet by mouth daily.   cyclobenzaprine  5 MG tablet Commonly known as: FLEXERIL  Take 1 tablet (5 mg total) by mouth 3 (three) times daily as needed for muscle spasms.   diclofenac Sodium 1 % Gel Commonly known as: VOLTAREN diclofenac 1 % topical gel   docusate sodium  50 MG capsule Commonly known as: COLACE Take 50 mg by mouth daily.   FLAXSEED OIL PO Take 1 tablet by mouth daily.    GARLIC PO Take 1 tablet by mouth daily.   glucosamine-chondroitin 500-400 MG tablet Take 1 tablet by mouth daily.   latanoprost  0.005 % ophthalmic solution Commonly known as: XALATAN  Place 1 drop into the right eye at bedtime.   lidocaine  5 % Commonly known as: LIDODERM  Place 2 patches onto the skin daily. Remove & Discard patch within 12 hours or as directed by MD Start taking on: May 10, 2023   meclizine 25 MG tablet Commonly known as: ANTIVERT meclizine 25 mg tablet   metFORMIN  500 MG tablet Commonly known as: GLUCOPHAGE  Take 500 mg by mouth 2 (two) times daily with a meal.   oxyCODONE  5 MG immediate release tablet Commonly known as: Oxy IR/ROXICODONE  Take 1-2 tablets (5-10 mg total) by mouth every 6 (six) hours as needed for moderate pain (pain score 4-6) or severe pain (pain score 7-10).   polyethylene glycol 17 g packet Commonly known as: MIRALAX  / GLYCOLAX  Take 17 g by mouth 2 (two) times daily as needed for mild constipation or moderate constipation.  Durable Medical Equipment  (From admission, onward)           Start     Ordered   05/09/23 1008  For home use only DME Walker rolling  Once       Question Answer Comment  Walker: With 5 Inch Wheels   Patient needs a walker to treat with the following condition Ambulatory dysfunction      05/09/23 1007            Follow-up Information     Yolanda Hence, MD Follow up in 1 week(s).   Specialty: Internal Medicine Contact information: [address] [place] [address] [phone]                No Known Allergies  You were cared for by a hospitalist during your hospital stay. If you have any questions about your discharge medications or the care you received while you were in the hospital after you are discharged, you can call the unit and asked to speak with the hospitalist on call if the hospitalist that took care of you is not available. Once you  are discharged, your primary care physician will handle any further medical issues. Please note that no refills for any discharge medications will be authorized once you are discharged, as it is imperative that you return to your primary care physician (or establish a relationship with a primary care physician if you do not have one) for your aftercare needs so that they can reassess your need for medications and monitor your lab values.  You were cared for by a hospitalist during your hospital stay. If you have any questions about your discharge medications or the care you received while you were in the hospital after you are discharged, you can call the unit and asked to speak with the hospitalist on call if the hospitalist that took care of you is not available. Once you are discharged, your primary care physician will handle any further medical issues. Please note that NO REFILLS for any discharge medications will be authorized once you are discharged, as it is imperative that you return to your primary care physician (or establish a relationship with a primary care physician if you do not have one) for your aftercare needs so that they can reassess your need for medications and monitor your lab values.  Please request your Prim.MD to go over all Hospital Tests and Procedure/Radiological results at the follow up, please get all Hospital records sent to your Prim MD by signing hospital release before you go home.  Get CBC, CMP, 2 view Chest X ray checked  by Primary MD during your next visit or SNF MD in 5-7 days ( we routinely change or add medications that can affect your baseline labs and fluid status, therefore we recommend that you get the mentioned basic workup next visit with your PCP, your PCP may decide not to get them or add new tests based on their clinical decision)  On your next visit with your primary care physician please Get Medicines reviewed and adjusted.  If you experience worsening  of your admission symptoms, develop shortness of breath, life threatening emergency, suicidal or homicidal thoughts you must seek medical attention immediately by calling 911 or calling your MD immediately  if symptoms less severe.  You Must read complete instructions/literature along with all the possible adverse reactions/side effects for all the Medicines you take and that have been prescribed to you. Take any new Medicines  after you have completely understood and accpet all the possible adverse reactions/side effects.   Do not drive, operate heavy machinery, perform activities at heights, swimming or participation in water activities or provide baby sitting services if your were admitted for syncope or siezures until you have seen by Primary MD or a Neurologist and advised to do so again.  Do not drive when taking Pain medications.   Procedures/Studies: VAS US  LOWER EXTREMITY VENOUS (DVT) (7a-7p) Result Date: 05/07/2023  Lower Venous DVT Study Patient Name:  HAN LYSNE  Date of Exam:   05/07/2023 Medical Rec #: [id number]      Accession #:    [id number] Date of Birth: 11-14-1926      Patient Gender: F Patient Age:   37 years Exam Location:  [facility] Procedure:      VAS US  LOWER EXTREMITY VENOUS (DVT) Referring Phys: HAYLEY NAASZ --------------------------------------------------------------------------------  Indications: Back and leg pain.  Limitations: Poor ultrasound/tissue interface and suboptimal positioning due to patient immobility. Comparison Study: Previous exam on 12/13/2021 Performing Technologist: Arlyce Berger RVT, RDMS  Examination Guidelines: A complete evaluation includes B-mode imaging, spectral Doppler, color Doppler, and power Doppler as needed of all accessible portions of each vessel. Bilateral testing is considered an integral part of a complete examination. Limited examinations for reoccurring indications may be performed as noted. The reflux portion of the exam is  performed with the patient in reverse Trendelenburg.  +---------+---------------+---------+-----------+----------+-------------------+ RIGHT    CompressibilityPhasicitySpontaneityPropertiesThrombus Aging      +---------+---------------+---------+-----------+----------+-------------------+ CFV      Full           Yes      Yes                                      +---------+---------------+---------+-----------+----------+-------------------+ SFJ      Full                                                             +---------+---------------+---------+-----------+----------+-------------------+ FV Prox  Full           Yes      Yes                                      +---------+---------------+---------+-----------+----------+-------------------+ FV Mid   Full           Yes      Yes                                      +---------+---------------+---------+-----------+----------+-------------------+ FV DistalFull           Yes      Yes                                      +---------+---------------+---------+-----------+----------+-------------------+ PFV      Full                                                             +---------+---------------+---------+-----------+----------+-------------------+  POP      Full           Yes      Yes                                      +---------+---------------+---------+-----------+----------+-------------------+ PTV                                                   Not well visualized +---------+---------------+---------+-----------+----------+-------------------+ PERO                                                  Not well visualized +---------+---------------+---------+-----------+----------+-------------------+   +----+---------------+---------+-----------+----------+--------------+ LEFTCompressibilityPhasicitySpontaneityPropertiesThrombus Aging  +----+---------------+---------+-----------+----------+--------------+ CFV Full           Yes      Yes                                 +----+---------------+---------+-----------+----------+--------------+    Summary: RIGHT: - There is no evidence of deep vein thrombosis in the lower extremity in areas visualized.  - No cystic structure found in the popliteal fossa. subcutaneous edema of calf and ankle.  LEFT: - No evidence of common femoral vein obstruction.   *See table(s) above for measurements and observations. Electronically signed by Angela Kell MD on 05/07/2023 at 5:10:28 PM.    Final    CT Thoracic Spine Wo Contrast Result Date: 05/07/2023 CLINICAL DATA:  Compression fracture EXAM: CT THORACIC AND LUMBAR SPINE WITHOUT CONTRAST TECHNIQUE: Multidetector CT imaging of the thoracic and lumbar spine was performed without contrast. Multiplanar CT image reconstructions were also generated. RADIATION DOSE REDUCTION: This exam was performed according to the departmental dose-optimization program which includes automated exposure control, adjustment of the mA and/or kV according to patient size and/or use of iterative reconstruction technique. COMPARISON:  04/22/2023 FINDINGS: CT THORACIC SPINE FINDINGS Alignment: Normal. Vertebrae: T11 compression fracture with approximately 25% height loss, unchanged. Paraspinal and other soft tissues: Calcific aortic atherosclerosis. Disc levels: No spinal canal stenosis CT LUMBAR SPINE FINDINGS Segmentation: Sacralization of L5. The lowest fully formed disc space is L4-5. Alignment: Grade 1 anterolisthesis at L2-3 and L3-4 Vertebrae: No acute fracture or focal pathologic process. Paraspinal and other soft tissues: Heterogeneous and partially calcified left lower pole renal mass Disc levels: No spinal canal stenosis. Mild right and moderate left L3 foraminal stenosis. Moderate right L4 foraminal stenosis. IMPRESSION: 1. Unchanged T11 compression fracture with  approximately 25% height loss. 2. No acute fracture or static subluxation of the thoracic or lumbar spine. 3. Heterogeneous and partially calcified left lower pole renal mass, unchanged. This remains most consistent with renal cell carcinoma. Aortic Atherosclerosis (ICD10-I70.0). Electronically Signed   By: Juanetta Nordmann M.D.   On: 05/07/2023 03:21   CT Lumbar Spine Wo Contrast Result Date: 05/07/2023 CLINICAL DATA:  Compression fracture EXAM: CT THORACIC AND LUMBAR SPINE WITHOUT CONTRAST TECHNIQUE: Multidetector CT imaging of the thoracic and lumbar spine was performed without contrast. Multiplanar CT image reconstructions were also generated. RADIATION DOSE REDUCTION: This exam was performed according to the departmental dose-optimization program which includes automated  exposure control, adjustment of the mA and/or kV according to patient size and/or use of iterative reconstruction technique. COMPARISON:  04/22/2023 FINDINGS: CT THORACIC SPINE FINDINGS Alignment: Normal. Vertebrae: T11 compression fracture with approximately 25% height loss, unchanged. Paraspinal and other soft tissues: Calcific aortic atherosclerosis. Disc levels: No spinal canal stenosis CT LUMBAR SPINE FINDINGS Segmentation: Sacralization of L5. The lowest fully formed disc space is L4-5. Alignment: Grade 1 anterolisthesis at L2-3 and L3-4 Vertebrae: No acute fracture or focal pathologic process. Paraspinal and other soft tissues: Heterogeneous and partially calcified left lower pole renal mass Disc levels: No spinal canal stenosis. Mild right and moderate left L3 foraminal stenosis. Moderate right L4 foraminal stenosis. IMPRESSION: 1. Unchanged T11 compression fracture with approximately 25% height loss. 2. No acute fracture or static subluxation of the thoracic or lumbar spine. 3. Heterogeneous and partially calcified left lower pole renal mass, unchanged. This remains most consistent with renal cell carcinoma. Aortic Atherosclerosis  (ICD10-I70.0). Electronically Signed   By: Juanetta Nordmann M.D.   On: 05/07/2023 03:21   MR THORACIC SPINE W WO CONTRAST Result Date: 04/22/2023 CLINICAL DATA:  Initial evaluation for acute mid to lower back pain. History of left breast malignancy. EXAM: MRI THORACIC WITHOUT AND WITH CONTRAST TECHNIQUE: Multiplanar and multiecho pulse sequences of the thoracic spine were obtained without and with intravenous contrast. CONTRAST:  7mL GADAVIST  GADOBUTROL  1 MMOL/ML IV SOLN COMPARISON:  Comparison made with prior bone scan from 04/18/2023. FINDINGS: Examination is somewhat technically limited as the upper cervical spine and dens is incompletely visualized on the counter sequence provided. Vertebral body count is therefore made from the first rib-bearing vertebral body. Alignment: Mild exaggeration of the normal thoracic kyphosis. No significant listhesis. Vertebrae: Abnormal T1 hypointense signal intensity with marrow replacement seen involving the T11 vertebral body with avid postcontrast enhancement, concerning for a possible osseous metastasis. Signal abnormality extends to involve the right pedicle of T11. Associated pathologic compression fracture at the inferior endplate of T11 with mild 15% height loss without bony retropulsion. No significant epidural or extraosseous extension of tumor at this level. No other evidence for metastatic disease elsewhere within the thoracic spine. Vertebral body height otherwise maintained. Bone marrow signal intensity otherwise normal. Cord: Normal signal and morphology. No abnormal enhancement. No significant epidural extension of tumor. Paraspinal and other soft tissues: Paraspinous soft tissues demonstrate no acute finding. Chronic fatty atrophy noted within the posterior paraspinous musculature. Trace layering bilateral pleural effusions noted. Few subcentimeter T2 hyperintense left renal cyst noted, benign in appearance, no follow-up imaging recommended. Disc levels: T4-5:  Left paracentral disc protrusion indents the ventral thecal sac (series 20, image 14). No significant spinal stenosis. Foramina remain patent. T5-6: Small central disc protrusion indents the ventral thecal sac (series 20, image 17). No significant spinal stenosis. Foramina remain patent. T9-10: Mild disc bulge. Mild bilateral facet hypertrophy. No significant spinal stenosis. Mild bilateral foraminal narrowing. T10-11: Mild disc bulge with reactive endplate spurring. Advanced bilateral facet arthrosis. No significant spinal stenosis. Moderate to severe bilateral foraminal narrowing. T11-12: Minimal annular disc bulge. Mild facet hypertrophy. No significant spinal stenosis. Mild bilateral foraminal narrowing. T12-L1: Normal interspace. Mild to moderate bilateral facet hypertrophy. No significant stenosis. IMPRESSION: 1. Abnormal marrow signal involving the T11 vertebral body, concerning for a possible osseous metastasis. Associated pathologic compression fracture at the inferior endplate of T11 with mild 15% height loss without bony retropulsion. No significant epidural or extraosseous extension of tumor at this level. 2. No other evidence for metastatic disease within the  thoracic spine. 3. Multilevel thoracic spondylosis as above. No significant spinal stenosis. Moderate to severe bilateral foraminal narrowing at T10-11. 4. Trace layering bilateral pleural effusions. Electronically Signed   By: Virgia Griffins M.D.   On: 04/22/2023 18:31   NM Bone Scan Whole Body Result Date: 04/19/2023 CLINICAL DATA:  89 year old female with left breast malignancy, metastatic left axillary lymphadenopathy. EXAM: NUCLEAR MEDICINE WHOLE BODY BONE SCAN TECHNIQUE: Whole body anterior and posterior images were obtained approximately 3 hours after intravenous injection of radiopharmaceutical. RADIOPHARMACEUTICALS:  18.6 mCi Technetium-21m MDP IV COMPARISON:  Restaging CT Chest, Abdomen, and Pelvis 08/24/2021. More recent two-view  chest radiographs 04/07/2023. FINDINGS: No prior bone scan for comparison. Expected radiotracer activity in the bilateral kidneys and urinary bladder. Intense, linear increased radiotracer activity in the spine at the level of a lower thoracic or upper lumbar vertebra. No correlation of this finding on the 2023 CT. Mildly heterogeneous radiotracer activity along the calvarium, subtle. Otherwise homogeneous radiotracer activity in the axial skeleton including elsewhere in the skull, spine, pelvis. Suboptimal radiotracer localization within the appendicular skeleton, upper and lower extremities. There is moderate to intense degenerative appearing activity fairly symmetrically affecting the bilateral knees. Less pronounced heterogeneous degenerative appearing activity in the left hindfoot, right elbow. IMPRESSION: 1. Indeterminate radiotracer activity within a lower thoracic or upper lumbar vertebra. No direct correlation on recent radiographs, or prior 2023 CT. Differential considerations include benign or pathologic compression fracture, metastasis. Thoracic MRI without and with contrast might best characterize further. 2. No other findings specific for metastatic disease to bone. Degenerative changes. Electronically Signed   By: Marlise Simpers M.D.   On: 04/19/2023 09:15   MM 3D DIAGNOSTIC MAMMOGRAM BILATERAL BREAST Result Date: 04/09/2023 CLINICAL DATA:  89 year old woman presents for follow-up of biopsy-proven left breast malignancy with metastatic left axillary lymphadenopathy (2020). EXAM: DIGITAL DIAGNOSTIC BILATERAL MAMMOGRAM WITH TOMOSYNTHESIS AND CAD; ULTRASOUND LEFT BREAST LIMITED TECHNIQUE: Bilateral digital diagnostic mammography and breast tomosynthesis was performed. The images were evaluated with computer-aided detection. ; Targeted ultrasound examination of the left breast was performed. COMPARISON:  Previous exam(s). ACR Breast Density Category c: The breasts are heterogeneously dense, which may obscure  small masses. FINDINGS: Right: Mammogram: No suspicious calcifications, mass, or distortions of the right breast. No findings suggestive of malignancy. Left: Mammogram: Mostly obscured mass in the inner left breast does not demonstrate significant change in appearance. No additional suspicious masses, asymmetries or calcifications are identified. Ultrasound: Targeted sonographic evaluation of the left breast 11 o'clock 2 cm from the nipple again demonstrates hypoechoic irregular mass measuring 3.7 x 1.4 x 3.2 cm. On the ultrasound from 11/15/2022, it measured 3.5 x 1.6 x 2.8 cm. On the ultrasound from 05/15/2022, it measured 3.6 x 1.4 x 3.6 cm. Multiple thickened lymph nodes in the left axilla are unchanged. IMPRESSION: 1. No significant interval change in size of known left breast malignancy with metastatic left axillary lymphadenopathy. 2. No evidence of right breast malignancy. RECOMMENDATION: Assuming continued medical therapy, follow-up left breast ultrasound in 6 months to reassess stability is recommended. I have discussed the findings and recommendations with the patient. If applicable, a reminder letter will be sent to the patient regarding the next appointment. BI-RADS CATEGORY  6: Known biopsy-proven malignancy. Electronically Signed   By: Elester Grim M.D.   On: 04/09/2023 15:36   US  LIMITED ULTRASOUND INCLUDING AXILLA LEFT BREAST  Result Date: 04/09/2023 CLINICAL DATA:  89 year old woman presents for follow-up of biopsy-proven left breast malignancy with metastatic left axillary lymphadenopathy (2020).  EXAM: DIGITAL DIAGNOSTIC BILATERAL MAMMOGRAM WITH TOMOSYNTHESIS AND CAD; ULTRASOUND LEFT BREAST LIMITED TECHNIQUE: Bilateral digital diagnostic mammography and breast tomosynthesis was performed. The images were evaluated with computer-aided detection. ; Targeted ultrasound examination of the left breast was performed. COMPARISON:  Previous exam(s). ACR Breast Density Category c: The breasts are  heterogeneously dense, which may obscure small masses. FINDINGS: Right: Mammogram: No suspicious calcifications, mass, or distortions of the right breast. No findings suggestive of malignancy. Left: Mammogram: Mostly obscured mass in the inner left breast does not demonstrate significant change in appearance. No additional suspicious masses, asymmetries or calcifications are identified. Ultrasound: Targeted sonographic evaluation of the left breast 11 o'clock 2 cm from the nipple again demonstrates hypoechoic irregular mass measuring 3.7 x 1.4 x 3.2 cm. On the ultrasound from 11/15/2022, it measured 3.5 x 1.6 x 2.8 cm. On the ultrasound from 05/15/2022, it measured 3.6 x 1.4 x 3.6 cm. Multiple thickened lymph nodes in the left axilla are unchanged. IMPRESSION: 1. No significant interval change in size of known left breast malignancy with metastatic left axillary lymphadenopathy. 2. No evidence of right breast malignancy. RECOMMENDATION: Assuming continued medical therapy, follow-up left breast ultrasound in 6 months to reassess stability is recommended. I have discussed the findings and recommendations with the patient. If applicable, a reminder letter will be sent to the patient regarding the next appointment. BI-RADS CATEGORY  6: Known biopsy-proven malignancy. Electronically Signed   By: Elester Grim M.D.   On: 04/09/2023 15:36     The results of significant diagnostics from this hospitalization (including imaging, microbiology, ancillary and laboratory) are listed below for reference.     Microbiology: No results found for this or any previous visit (from the past 240 hours).   Labs: BNP (last 3 results) No results for input(s): "BNP" in the last 8760 hours. Basic Metabolic Panel: Recent Labs  Lab 05/06/23 1519 05/07/23 0445 05/09/23 0549  NA 134* 132* 132*  K 4.2 3.4* 4.0  CL 100 100 100  CO2 24 25 26   GLUCOSE 95 97 99  BUN 14 13 11   CREATININE 0.58 0.57 0.47  CALCIUM  9.8 9.3 9.4  MG   --  1.5*  --   PHOS  --  3.1  --    Liver Function Tests: Recent Labs  Lab 05/06/23 1519  AST 15  ALT 9  ALKPHOS 49  BILITOT 0.7  PROT 7.4  ALBUMIN 3.9   No results for input(s): "LIPASE", "AMYLASE" in the last 168 hours. No results for input(s): "AMMONIA" in the last 168 hours. CBC: Recent Labs  Lab 05/06/23 1519 05/07/23 0445 05/09/23 0549  WBC 4.5 4.5 3.4*  NEUTROABS 2.8  --   --   HGB 9.7* 9.1* 9.2*  HCT 31.4* 28.3* 28.9*  MCV 96.0 94.0 92.9  PLT 185 160 166   Cardiac Enzymes: No results for input(s): "CKTOTAL", "CKMB", "CKMBINDEX", "TROPONINI" in the last 168 hours. BNP: Invalid input(s): "POCBNP" CBG: Recent Labs  Lab 05/08/23 1144 05/08/23 1721 05/08/23 2113 05/09/23 0733  GLUCAP 92 100* 86 98   D-Dimer No results for input(s): "DDIMER" in the last 72 hours. Hgb A1c No results for input(s): "HGBA1C" in the last 72 hours. Lipid Profile No results for input(s): "CHOL", "HDL", "LDLCALC", "TRIG", "CHOLHDL", "LDLDIRECT" in the last 72 hours. Thyroid function studies Recent Labs    05/08/23 1337  TSH 1.212   Anemia work up No results for input(s): "VITAMINB12", "FOLATE", "FERRITIN", "TIBC", "IRON", "RETICCTPCT" in the last 72 hours. Urinalysis  Component Value Date/Time   COLORURINE STRAW (A) 05/06/2023 2137   APPEARANCEUR CLEAR 05/06/2023 2137   LABSPEC 1.010 05/06/2023 2137   PHURINE 6.0 05/06/2023 2137   GLUCOSEU NEGATIVE 05/06/2023 2137   HGBUR NEGATIVE 05/06/2023 2137   BILIRUBINUR NEGATIVE 05/06/2023 2137   KETONESUR 5 (A) 05/06/2023 2137   PROTEINUR NEGATIVE 05/06/2023 2137   UROBILINOGEN 0.2 06/03/2009 1357   NITRITE NEGATIVE 05/06/2023 2137   LEUKOCYTESUR NEGATIVE 05/06/2023 2137   Sepsis Labs Recent Labs  Lab 05/06/23 1519 05/07/23 0445 05/09/23 0549  WBC 4.5 4.5 3.4*   Microbiology No results found for this or any previous visit (from the past 240 hours).   Time coordinating discharge:  I have spent 35 minutes face to  face with the patient and on the ward discussing the patients care, assessment, plan and disposition with other care givers. >50% of the time was devoted counseling the patient about the risks and benefits of treatment/Discharge disposition and coordinating care.   SIGNED:   Otis Schooner, MD  [facility] 05/09/2023, 10:50 AM   If 7PM-7AM, please contact night-coverage

## 2023-05-09 NOTE — Evaluation (Signed)
 Occupational Therapy Evaluation Patient Details Name: April Gallagher MRN: [id number] DOB: 06/25/1926 Today's Date: 05/09/2023   History of Present Illness   April Gallagher is a 89 y.o. female with medical history significant of arthritis, breast cancer, type 2 diabetes, hypertension who presented to the emergency department with complaints of exacerbation of back pain due to T11 compression fracture.     Clinical Impressions Pt admitted with the above diagnosis. Received with daughter present in room. PTA, pt reports living with granddaughter who provides assist for showering and UB/LB ADLs as needed; pt is able to sponge bathe seated at sink. Pt is a limited household ambulator using [id number], and family provides transport to MD appointments using a manual wheelchair. Pt close or at functional baseline upon OT eval. Performs functional transfers with CGA progressing to supervision, functional mobility ~20 ft in room using RW and oral care seated in recliner. Anticipate up to min A for UB/LB ADLs. OT will continue to follow acutely to increase pt's independence and safety for hospital discharge. Would benefit from [facility] OT.      If plan is discharge home, recommend the following:   A little help with walking and/or transfers;A little help with bathing/dressing/bathroom;Assistance with cooking/housework;Direct supervision/assist for financial management;Assist for transportation     Functional Status Assessment   Patient has had a recent decline in their functional status and demonstrates the ability to make significant improvements in function in a reasonable and predictable amount of time.     Equipment Recommendations   None recommended by OT      Precautions/Restrictions   Precautions Precautions: Fall Restrictions Weight Bearing Restrictions Per Provider Order: No     Mobility Bed Mobility Overal bed mobility: Needs Assistance             General bed mobility comments:  NT, pt in recliner pre and post session    Transfers Overall transfer level: Needs assistance Equipment used: Rolling walker (2 wheels) Transfers: Sit to/from Stand Sit to Stand: Contact guard assist           General transfer comment: increased time, CGA progressing to supervision      Balance Overall balance assessment: Needs assistance Sitting-balance support: Feet supported, No upper extremity supported Sitting balance-Leahy Scale: Fair     Standing balance support: Reliant on assistive device for balance, Bilateral upper extremity supported Standing balance-Leahy Scale: Poor Standing balance comment: forward flexed posture                           ADL either performed or assessed with clinical judgement   ADL Overall ADL's : At baseline;Needs assistance/impaired Eating/Feeding: Set up;Sitting   Grooming: Sitting;Oral care;Set up Grooming Details (indicate cue type and reason): mouthwash seated in recliner Upper Body Bathing: Minimal assistance;Sitting   Lower Body Bathing: Minimal assistance;Sit to/from stand   Upper Body Dressing : Sitting;Minimal assistance;Set up   Lower Body Dressing: Minimal assistance;Sit to/from stand   Toilet Transfer: Contact guard assist;Rolling walker (2 wheels);Ambulation Toilet Transfer Details (indicate cue type and reason): clinical judgement Toileting- Clothing Manipulation and Hygiene: Minimal assistance;Sit to/from stand       Functional mobility during ADLs: Contact guard assist;Rolling walker (2 wheels) General ADL Comments: Pt at or close to functional baseline. Oral care seated in recliner, anticipate up to MIN A for ADLs and walks short distance in room with RW, CGA. Forward flexed posture and slow gait baseline per daughter.  Vision Baseline Vision/History: 1 Wears glasses Ability to See in Adequate Light: 1 Impaired Patient Visual Report: No change from baseline Additional Comments: unable to read  fine print, reads clock on wall WNL            Pertinent Vitals/Pain Pain Assessment Pain Assessment: No/denies pain Pain Descriptors / Indicators:  (fluctuates with activity but pt reports overall feeling much better)     Extremity/Trunk Assessment Upper Extremity Assessment Upper Extremity Assessment: Generalized weakness   Lower Extremity Assessment Lower Extremity Assessment: Generalized weakness   Cervical / Trunk Assessment Cervical / Trunk Assessment: Kyphotic   Communication Communication Communication: No apparent difficulties   Cognition Arousal: Alert Behavior During Therapy: WFL for tasks assessed/performed                                 Following commands: Intact       Cueing  General Comments   Cueing Techniques: Verbal cues              Home Living Family/patient expects to be discharged to:: Private residence Living Arrangements: Children;Other relatives Available Help at Discharge: Available 24 hours/day Type of Home: House Home Access: Level entry     Home Layout: One level     Bathroom Shower/Tub: Chief Strategy Officer: Handicapped height Bathroom Accessibility: Yes How Accessible: Accessible via walker Home Equipment: Rollator (4 wheels);Cane - single point;Tub bench;Grab bars - tub/shower;BSC/3in1;Wheelchair - manual;Hand held shower head          Prior Functioning/Environment Prior Level of Function : Needs assist             Mobility Comments: limited household ambulation with 4WW.  manual wc for MD appts / community outings ADLs Comments: requires assist for showers from granddaughter, intermittent assist for UB/LB bathing    OT Problem List: Decreased strength;Decreased range of motion;Decreased activity tolerance;Impaired balance (sitting and/or standing);Decreased knowledge of use of DME or AE;Decreased knowledge of precautions;Pain   OT Treatment/Interventions: Self-care/ADL  training;Therapeutic exercise;DME and/or AE instruction;Therapeutic activities;Patient/family education;Balance training      OT Goals(Current goals can be found in the care plan section)   Acute Rehab OT Goals OT Goal Formulation: With patient/family Time For Goal Achievement: 05/23/23 Potential to Achieve Goals: Good   OT Frequency:  Min 2X/week       AM-PAC OT "6 Clicks" Daily Activity     Outcome Measure Help from another person eating meals?: None Help from another person taking care of personal grooming?: None Help from another person toileting, which includes using toliet, bedpan, or urinal?: A Little Help from another person bathing (including washing, rinsing, drying)?: A Little Help from another person to put on and taking off regular upper body clothing?: A Little Help from another person to put on and taking off regular lower body clothing?: A Little 6 Click Score: 20   End of Session Equipment Utilized During Treatment: Gait belt;Rolling walker (2 wheels) Nurse Communication: Mobility status  Activity Tolerance: Patient tolerated treatment well;No increased pain Patient left: in chair;with call bell/phone within reach;with chair alarm set;with family/visitor present;with nursing/sitter in room  OT Visit Diagnosis: Muscle weakness (generalized) (M62.81);Pain;Unsteadiness on feet (R26.81);Other abnormalities of gait and mobility (R26.89) Pain - Right/Left:  (back) Pain - part of body:  (back)                Time: 1610-9604 OT Time Calculation (min): 13 min Charges:  OT General Charges $OT Visit: 1 Visit OT Evaluation $OT Eval Low Complexity: 1 Low Javeion Cannedy L. Sharece Fleischhacker, OTR/L  05/09/23, 11:16 AM

## 2023-05-09 NOTE — TOC Transition Note (Signed)
 Transition of Care [facility]) - Discharge Note   Patient Details  Name: April Gallagher MRN: [id number] Date of Birth: 1926-04-12  [facility]) CM/SW Contact:  Marty Sleet, LCSW Phone Number: 05/09/2023, 12:25 PM   Clinical Narrative:    Met with pt and daughter at bedside and confirmed recommendation for HHPT/OT and for RW. Pt's daughter shares pt has never had HH services and they do not have a preference for an agency. HHPT/OT has been arranged with [facility]. RW ordered through [facility] and to be delivered to pt's room prior to discharge. CSW provided pt's daughter with information for private caregiver agencies as she plans to hire a CNA to assist as well. Pt's daughter to provide transportation for pt at discharge.    Final next level of care: Home w Home Health Services Barriers to Discharge: Barriers Resolved   Patient Goals and CMS Choice Patient states their goals for this hospitalization and ongoing recovery are:: To return home at discharge CMS Medicare.gov Compare Post Acute Care list provided to:: Patient Choice offered to / list presented to : NA [place] ownership interest in [facility].provided to:: Parent NA    Discharge Placement                       Discharge Plan and Services Additional resources added to the After Visit Summary for     Discharge Planning Services: CM Consult Post Acute Care Choice: NA          DME Arranged: Otho Blitz rolling DME Agency: [employer] Date DME Agency Contacted: 05/09/23 Time DME Agency Contacted: 1224 Representative spoke with at DME Agency: April HH Arranged: PT, OT [facility] Agency: [facility] Date [facility] Agency Contacted: 05/09/23 Time HH Agency Contacted: 1224 Representative spoke with at [facility] Agency: Shelvy Dickens  Social Drivers of Health (SDOH) Interventions SDOH Screenings   Food Insecurity: Patient Declined (05/07/2023)  Housing: Patient Declined (05/07/2023)  Transportation Needs:  Patient Declined (05/07/2023)  Utilities: Patient Declined (05/07/2023)  Social Connections: Patient Declined (05/07/2023)  Tobacco Use: Low Risk  (05/06/2023)     Readmission Risk Interventions    05/08/2023    3:06 PM  Readmission Risk Prevention Plan  Transportation Screening Complete  PCP or Specialist Appt within 5-7 Days Complete  Home Care Screening Complete  Medication Review (RN CM) Complete

## 2023-05-09 NOTE — Progress Notes (Signed)
 Oxycodone  IR 5mg  q6hrs prn #30 tablets sent to their pharmacy of choice.  Further refill can be provided by PCP or outpatient provider as appropriate.   Rand Burrs MD

## 2023-05-09 NOTE — Plan of Care (Signed)
  Problem: Pain Managment: Goal: General experience of comfort will improve and/or be controlled Outcome: Progressing   Problem: Fluid Volume: Goal: Ability to maintain a balanced intake and output will improve Outcome: Progressing   Problem: Metabolic: Goal: Ability to maintain appropriate glucose levels will improve Outcome: Progressing   Problem: Tissue Perfusion: Goal: Adequacy of tissue perfusion will improve Outcome: Progressing

## 2023-05-13 ENCOUNTER — Inpatient Hospital Stay

## 2023-05-13 ENCOUNTER — Other Ambulatory Visit: Payer: Self-pay

## 2023-05-13 ENCOUNTER — Inpatient Hospital Stay: Admitting: Hematology and Oncology

## 2023-05-13 VITALS — BP 115/78 | HR 107 | Resp 20

## 2023-05-13 DIAGNOSIS — Z1732 Human epidermal growth factor receptor 2 negative status: Secondary | ICD-10-CM | POA: Diagnosis not present

## 2023-05-13 DIAGNOSIS — C50212 Malignant neoplasm of upper-inner quadrant of left female breast: Secondary | ICD-10-CM

## 2023-05-13 DIAGNOSIS — Z170 Estrogen receptor positive status [ER+]: Secondary | ICD-10-CM

## 2023-05-13 DIAGNOSIS — Z79818 Long term (current) use of other agents affecting estrogen receptors and estrogen levels: Secondary | ICD-10-CM | POA: Diagnosis not present

## 2023-05-13 DIAGNOSIS — Z1722 Progesterone receptor negative status: Secondary | ICD-10-CM | POA: Diagnosis not present

## 2023-05-13 DIAGNOSIS — S22089A Unspecified fracture of T11-T12 vertebra, initial encounter for closed fracture: Secondary | ICD-10-CM | POA: Diagnosis not present

## 2023-05-13 DIAGNOSIS — Y939 Activity, unspecified: Secondary | ICD-10-CM | POA: Diagnosis not present

## 2023-05-13 DIAGNOSIS — M546 Pain in thoracic spine: Secondary | ICD-10-CM | POA: Diagnosis not present

## 2023-05-13 MED ORDER — FULVESTRANT 250 MG/5ML IM SOSY: 500.0000 mg | PREFILLED_SYRINGE | Freq: Once | INTRAMUSCULAR | Status: DC

## 2023-05-13 NOTE — Progress Notes (Signed)
 Hospice referral and MD notes faxed to Traci at [facility] of the [place]. [phone]. Fax confirmation received.

## 2023-05-13 NOTE — Progress Notes (Signed)
 Patient Care Team: Yolanda Hence, MD as PCP - General (Internal Medicine) Cameron Cea, MD as Consulting Physician (Hematology and Oncology)  DIAGNOSIS:  Encounter Diagnosis  Name Primary?   Malignant neoplasm of upper-inner quadrant of left breast in female, estrogen receptor positive (HCC) Yes    SUMMARY OF ONCOLOGIC HISTORY: Oncology History  Malignant neoplasm of upper-inner quadrant of left breast in female, estrogen receptor positive (HCC)  08/26/2018 Initial Diagnosis   Palpable mass 4.2cm in the upper inner left breast at the 11 o'clock position with 4 enlarged axillary lymph nodes. Biopsy on 08/26/18 showed invasive mammary carcinoma, grade 1, HER-2 negative (1+), ER 100%, PR 0%, Ki67 15% in the left breast and axilla.   08/26/2018 Cancer Staging   Staging form: Breast, AJCC 8th Edition - Clinical stage from 08/26/2018: Stage IIB (cT2, cN1, cM0, G1, ER+, PR-, HER2-) - Signed by Percival Brace, NP on 09/03/2018   09/02/2018 -  Anti-estrogen oral therapy   Palliative anti-estrogen therapy with anastrozole       CHIEF COMPLIANT: Follow-up to discuss her treatment plan, intractable back pain  HISTORY OF PRESENT ILLNESS:  History of Present Illness The patient, with a history of a breast cancer, presents with severe back pain, which is related to a fracture at T11. The pain is constant and worsens with movement. The patient describes it as a feeling of something tight across the back. The pain also radiates down the side into the leg, making it difficult to move. Despite being on strong pain medication, including morphine  and muscle relaxers, the relief is temporary and lasts only about two to three hours. The patient has been using a back brace as prescribed by an orthopedic doctor, but it has not provided any relief. The patient also has a history of a breast tumor, which has been managed with monthly injections. The injections were stopped due to the patient's current  condition and the belief that they were not providing any significant benefit.     ALLERGIES:  has no known allergies.  MEDICATIONS:  Current Outpatient Medications  Medication Sig Dispense Refill   ACCU-CHEK SMARTVIEW test strip      amLODipine  (NORVASC ) 10 MG tablet Take 10 mg by mouth daily.     aspirin  EC 81 MG tablet Take 81 mg by mouth daily. Swallow whole.     atorvastatin  (LIPITOR) 40 MG tablet Take 40 mg by mouth at bedtime.      brimonidine  (ALPHAGAN ) 0.2 % ophthalmic solution INSTILL 1 DROP INTO RIGHT EYE TWICE DAILY     calcium  carbonate (OS-CAL) 600 MG TABS tablet Take 600 mg by mouth every morning.      clotrimazole-betamethasone (LOTRISONE) cream clotrimazole-betamethasone 1 %-0.05 % topical cream     Coenzyme Q10 (CO Q 10 PO) Take 1 tablet by mouth daily.     cyclobenzaprine  (FLEXERIL ) 5 MG tablet Take 1 tablet (5 mg total) by mouth 3 (three) times daily as needed for muscle spasms. 15 tablet 0   diclofenac Sodium (VOLTAREN) 1 % GEL diclofenac 1 % topical gel     docusate sodium  (COLACE) 50 MG capsule Take 50 mg by mouth daily.     GARLIC PO Take 1 tablet by mouth daily.      glucosamine-chondroitin 500-400 MG tablet Take 1 tablet by mouth daily.     glucose blood test strip Accu-Chek SmartView Test Strips  CHECK BLOOD SUGAR TWICE  DAILY     latanoprost  (XALATAN ) 0.005 % ophthalmic solution Place 1 drop into  the right eye at bedtime.      lidocaine  (LIDODERM ) 5 % Place 2 patches onto the skin daily. Remove & Discard patch within 12 hours or as directed by MD 30 patch 0   meclizine (ANTIVERT) 25 MG tablet meclizine 25 mg tablet     metFORMIN  (GLUCOPHAGE ) 500 MG tablet Take 500 mg by mouth 2 (two) times daily with a meal.      oxyCODONE  (OXY IR/ROXICODONE ) 5 MG immediate release tablet Take 1-2 tablets (5-10 mg total) by mouth every 6 (six) hours as needed for moderate pain (pain score 4-6) or severe pain (pain score 7-10). 30 tablet 0   oxycodone  (OXY-IR) 5 MG capsule Take  1 capsule (5 mg total) by mouth every 6 (six) hours as needed for pain. 30 capsule 0   polyethylene glycol (MIRALAX  / GLYCOLAX ) 17 g packet Take 17 g by mouth 2 (two) times daily as needed for mild constipation or moderate constipation. 14 each 1   benzonatate  (TESSALON ) 100 MG capsule Take 1 capsule (100 mg total) by mouth 3 (three) times daily as needed for cough. (Patient not taking: Reported on 05/07/2023) 21 capsule 0   Flaxseed, Linseed, (FLAXSEED OIL PO) Take 1 tablet by mouth daily.  (Patient not taking: Reported on 05/07/2023)     No current facility-administered medications for this visit.   Facility-Administered Medications Ordered in Other Visits  Medication Dose Route Frequency Provider Last Rate Last Admin   fulvestrant  (FASLODEX ) injection 500 mg  500 mg Intramuscular Once Yvetta Drotar, MD        PHYSICAL EXAMINATION: ECOG PERFORMANCE STATUS: 1 - Symptomatic but completely ambulatory  Vitals:   05/13/23 1358  BP: 115/78  Pulse: (!) 107  Resp: 20  SpO2: 100%   There were no vitals filed for this visit.  Physical Exam  Intractable mid back pain  (exam performed in the presence of a chaperone)  LABORATORY DATA:  I have reviewed the data as listed    Latest Ref Rng & Units 05/09/2023    5:49 AM 05/07/2023    4:45 AM 05/06/2023    3:19 PM  CMP  Glucose 70 - 99 mg/dL 99  97  95   BUN 8 - 23 mg/dL 11  13  14    Creatinine 0.44 - 1.00 mg/dL 1.61  0.96  0.45   Sodium 135 - 145 mmol/L 132  132  134   Potassium 3.5 - 5.1 mmol/L 4.0  3.4  4.2   Chloride 98 - 111 mmol/L 100  100  100   CO2 22 - 32 mmol/L 26  25  24    Calcium  8.9 - 10.3 mg/dL 9.4  9.3  9.8   Total Protein 6.5 - 8.1 g/dL   7.4   Total Bilirubin 0.0 - 1.2 mg/dL   0.7   Alkaline Phos 38 - 126 U/L   49   AST 15 - 41 U/L   15   ALT 0 - 44 U/L   9     Lab Results  Component Value Date   WBC 3.4 (L) 05/09/2023   HGB 9.2 (L) 05/09/2023   HCT 28.9 (L) 05/09/2023   MCV 92.9 05/09/2023   PLT 166 05/09/2023    NEUTROABS 2.8 05/06/2023    ASSESSMENT & PLAN:  Malignant neoplasm of upper-inner quadrant of left breast in female, estrogen receptor positive (HCC) 08/26/2018:Palpable mass 4.2cm in the upper inner left breast at the 11 o'clock position with 4 enlarged axillary lymph nodes. Biopsy  on 08/26/18 showed invasive lobular carcinoma, grade 1-2, HER-2 negative (1+), ER 100%, PR 0%, Ki67 15% in the left breast and axilla. T2N1 stage IIa   CT scans showed the breast cancer and the lymphadenopathy in the axilla and the subpectoral areas.  In addition to this she had a left kidney mass measuring 4.5 x 3.5 cm.  Could be a primary renal cell cancer.  But given her age and health issues no surgical approaches are planned.   Current treatment: Palliative treatment with anastrozole  1 mg daily discontinued 08/10/2021 switched to Faslodex  August 2023   Breast cancer surveillance: 1. Left breast mammogram and ultrasound 05/15/2022: 3.6 cm left breast mass with left axillary lymph nodes (stable disease) 2. Breast exam 02/04/2023: Benign   Intractable back pain:  Bone scan 04/19/2023: Indeterminate radiotracer uptake in lower thoracic and upper lumbar vertebra. MRI thoracic spine 04/22/2023: Abnormal marrow signal T11 concerning for possible bone metastasis associated pathological compression fracture, spondylosis  Hospitalization 05/06/2023-05/09/2023: Intractable back pain currently on oral pain medications Recommendation: Based upon her acuity of symptoms I recommended hospice care for her. We will cancel all further injections. Patient will be seen by us  on an as-needed basis. ------------------------------------- Assessment and Plan Assessment & Plan Fracture of T11 vertebra T11 fracture causing significant pain and mobility issues. Possible cancerous nature suggested by orthopedic surgeon, biopsy declined. Radiation therapy deemed intolerable. - Arrange hospice care for pain management and home support. -  Discontinue physical therapy. - Provide hospital bed and equipment through hospice.  Intractable back pain Severe, constant back pain affecting mobility and quality of life. Current pain management insufficient. Hospice care recommended for comprehensive management. - Initiate hospice care for pain management and support. - Administer oxycodone  and muscle relaxers more frequently until hospice care is established. - Transition to liquid morphine  as advised by hospice.  Malignant neoplasm of left breast Neoplasm not active or symptomatic. Injections previously used to prevent growth deemed unnecessary. Discontinued based on lack of symptoms and her preference. - Discontinue injections for breast cancer. - No further appointments for breast cancer management necessary.  Goals of Care Focus on comfort and quality of life. Hospice care initiated to manage pain and provide support. Declined further aggressive treatments to avoid additional discomfort. - Initiate hospice care for comfort and quality of life. - Avoid aggressive treatments.      No orders of the defined types were placed in this encounter.  The patient has a good understanding of the overall plan. she agrees with it. she will call with any problems that may develop before the next visit here. Total time spent: 45 mins including face to face time and time spent for planning, charting and co-ordination of care   Margert Sheerer, MD 05/13/23

## 2023-05-13 NOTE — Assessment & Plan Note (Signed)
 08/26/2018:Palpable mass 4.2cm in the upper inner left breast at the 11 o'clock position with 4 enlarged axillary lymph nodes. Biopsy on 08/26/18 showed invasive lobular carcinoma, grade 1-2, HER-2 negative (1+), ER 100%, PR 0%, Ki67 15% in the left breast and axilla. T2N1 stage IIa   CT scans showed the breast cancer and the lymphadenopathy in the axilla and the subpectoral areas.  In addition to this she had a left kidney mass measuring 4.5 x 3.5 cm.  Could be a primary renal cell cancer.  But given her age and health issues no surgical approaches are planned.   Current treatment: Palliative treatment with anastrozole  1 mg daily discontinued 08/10/2021 switched to Faslodex  August 2023   Breast cancer surveillance: 1. Left breast mammogram and ultrasound 05/15/2022: 3.6 cm left breast mass with left axillary lymph nodes (stable disease) 2. Breast exam 02/04/2023: Benign   Intractable back pain:  Bone scan 04/19/2023: Indeterminate radiotracer uptake in lower thoracic and upper lumbar vertebra. MRI thoracic spine 04/22/2023: Abnormal marrow signal T11 concerning for possible bone metastasis associated pathological compression fracture, spondylosis  Hospitalization 05/06/2023-05/09/2023: Intractable back pain currently on oral pain medications

## 2023-05-24 ENCOUNTER — Telehealth: Payer: Self-pay

## 2023-05-24 NOTE — Telephone Encounter (Signed)
 LVM to schedule DM shoe PU

## 2023-06-04 ENCOUNTER — Ambulatory Visit: Payer: Medicare Other

## 2023-06-04 ENCOUNTER — Ambulatory Visit: Payer: Medicare Other | Admitting: Hematology and Oncology

## 2023-06-16 DEATH — deceased
